# Patient Record
Sex: Female | Born: 1960
Health system: Southern US, Community
[De-identification: ages and names within clinical notes are randomized; demographics above are authoritative.]

## PROBLEM LIST (undated history)

## (undated) DIAGNOSIS — I10 Essential (primary) hypertension: Secondary | ICD-10-CM

## (undated) HISTORY — PX: ANTERIOR CRUCIATE LIGAMENT REPAIR: SHX115

## (undated) HISTORY — DX: Essential (primary) hypertension: I10

---

## 1998-06-04 ENCOUNTER — Other Ambulatory Visit: Admission: RE | Admit: 1998-06-04 | Discharge: 1998-06-04 | Payer: Self-pay | Admitting: Obstetrics & Gynecology

## 1999-07-28 ENCOUNTER — Other Ambulatory Visit: Admission: RE | Admit: 1999-07-28 | Discharge: 1999-07-28 | Payer: Self-pay | Admitting: Obstetrics & Gynecology

## 2000-11-03 ENCOUNTER — Other Ambulatory Visit: Admission: RE | Admit: 2000-11-03 | Discharge: 2000-11-03 | Payer: Self-pay | Admitting: Obstetrics & Gynecology

## 2001-11-12 ENCOUNTER — Other Ambulatory Visit: Admission: RE | Admit: 2001-11-12 | Discharge: 2001-11-12 | Payer: Self-pay | Admitting: Obstetrics and Gynecology

## 2002-11-19 ENCOUNTER — Other Ambulatory Visit: Admission: RE | Admit: 2002-11-19 | Discharge: 2002-11-19 | Payer: Self-pay | Admitting: Obstetrics & Gynecology

## 2003-11-24 ENCOUNTER — Other Ambulatory Visit: Admission: RE | Admit: 2003-11-24 | Discharge: 2003-11-24 | Payer: Self-pay | Admitting: Obstetrics and Gynecology

## 2005-06-20 ENCOUNTER — Other Ambulatory Visit: Admission: RE | Admit: 2005-06-20 | Discharge: 2005-06-20 | Payer: Self-pay | Admitting: Obstetrics & Gynecology

## 2006-12-27 ENCOUNTER — Encounter: Admission: RE | Admit: 2006-12-27 | Discharge: 2006-12-27 | Payer: Self-pay | Admitting: Internal Medicine

## 2008-01-13 IMAGING — CR DG CHEST 2V
2 series · 2 of 2 positions shown · non-contrast
Comparison: none

CLINICAL DATA: Cough

Chest 2 view:
No previous for comparison. Mild thoracic   dextroscoliosis. Lungs clear, mildly
hyperinflated. Heart size and pulmonary vascularity within normal limits. No
effusion.

[view not recorded (1 of 2)]
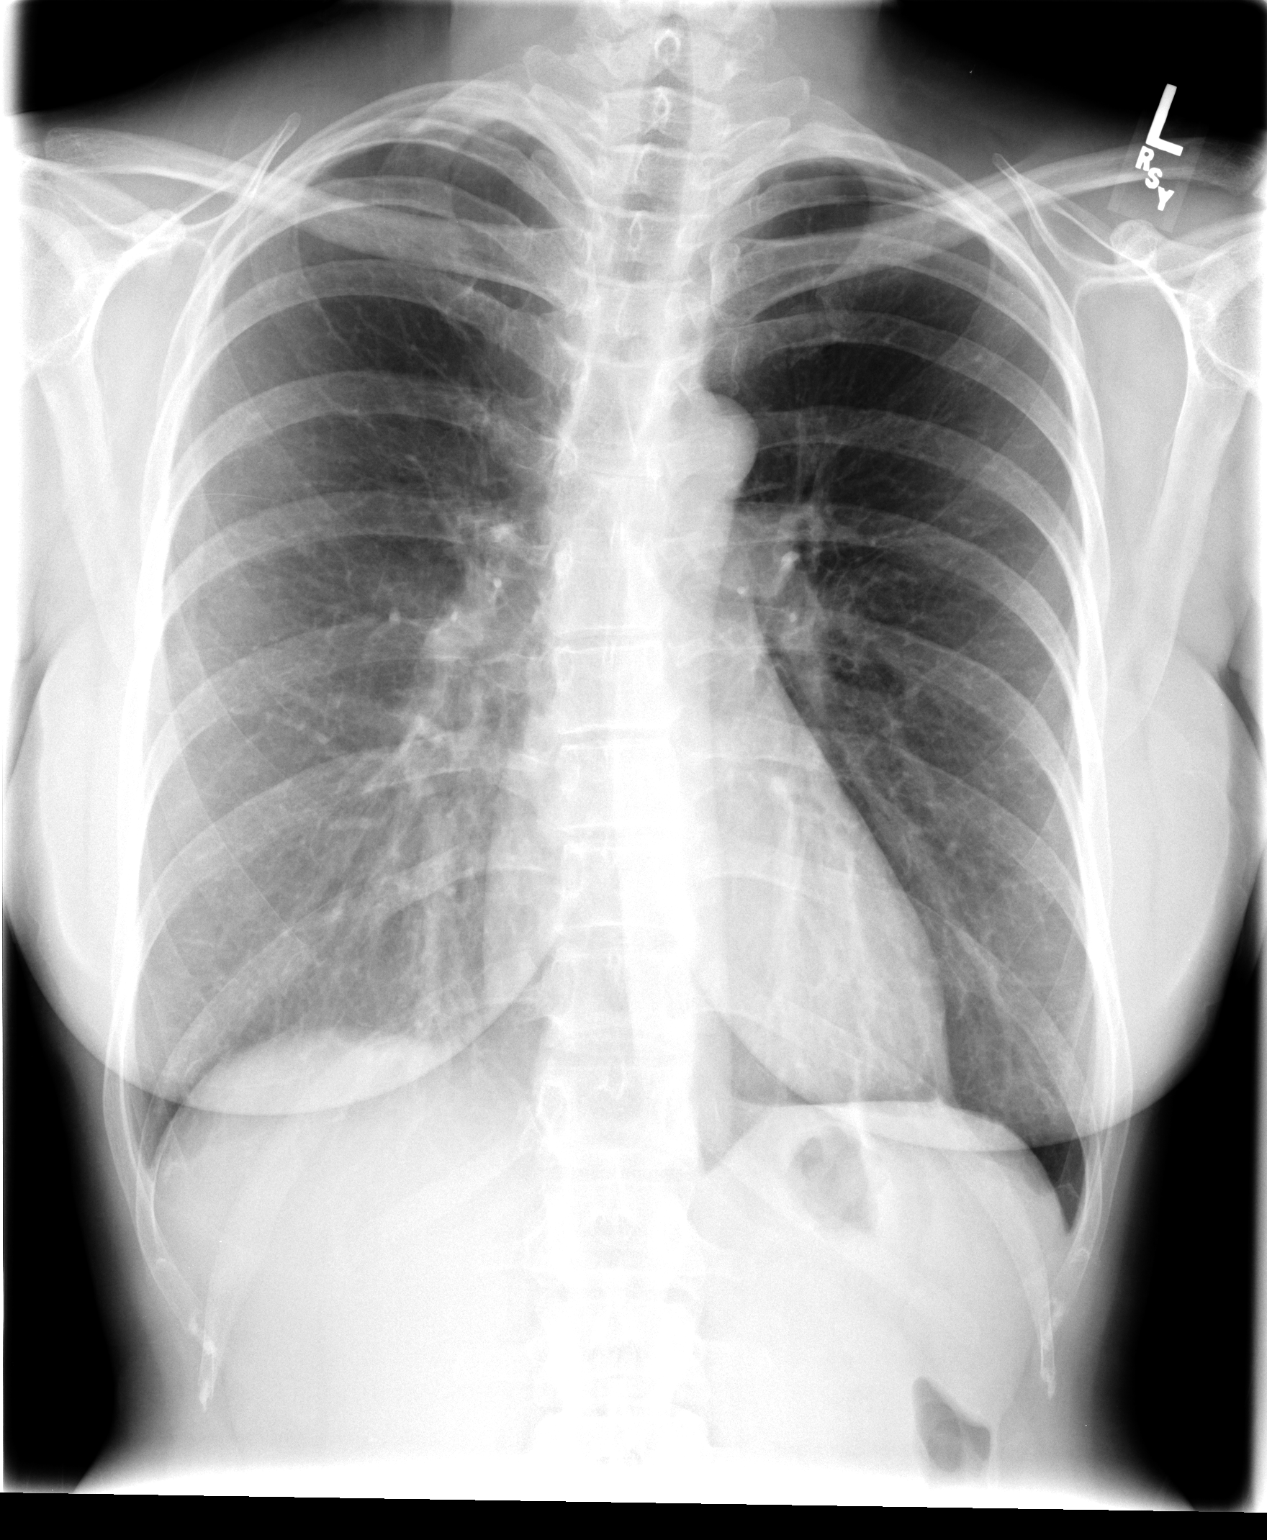

[view not recorded (2 of 2)]
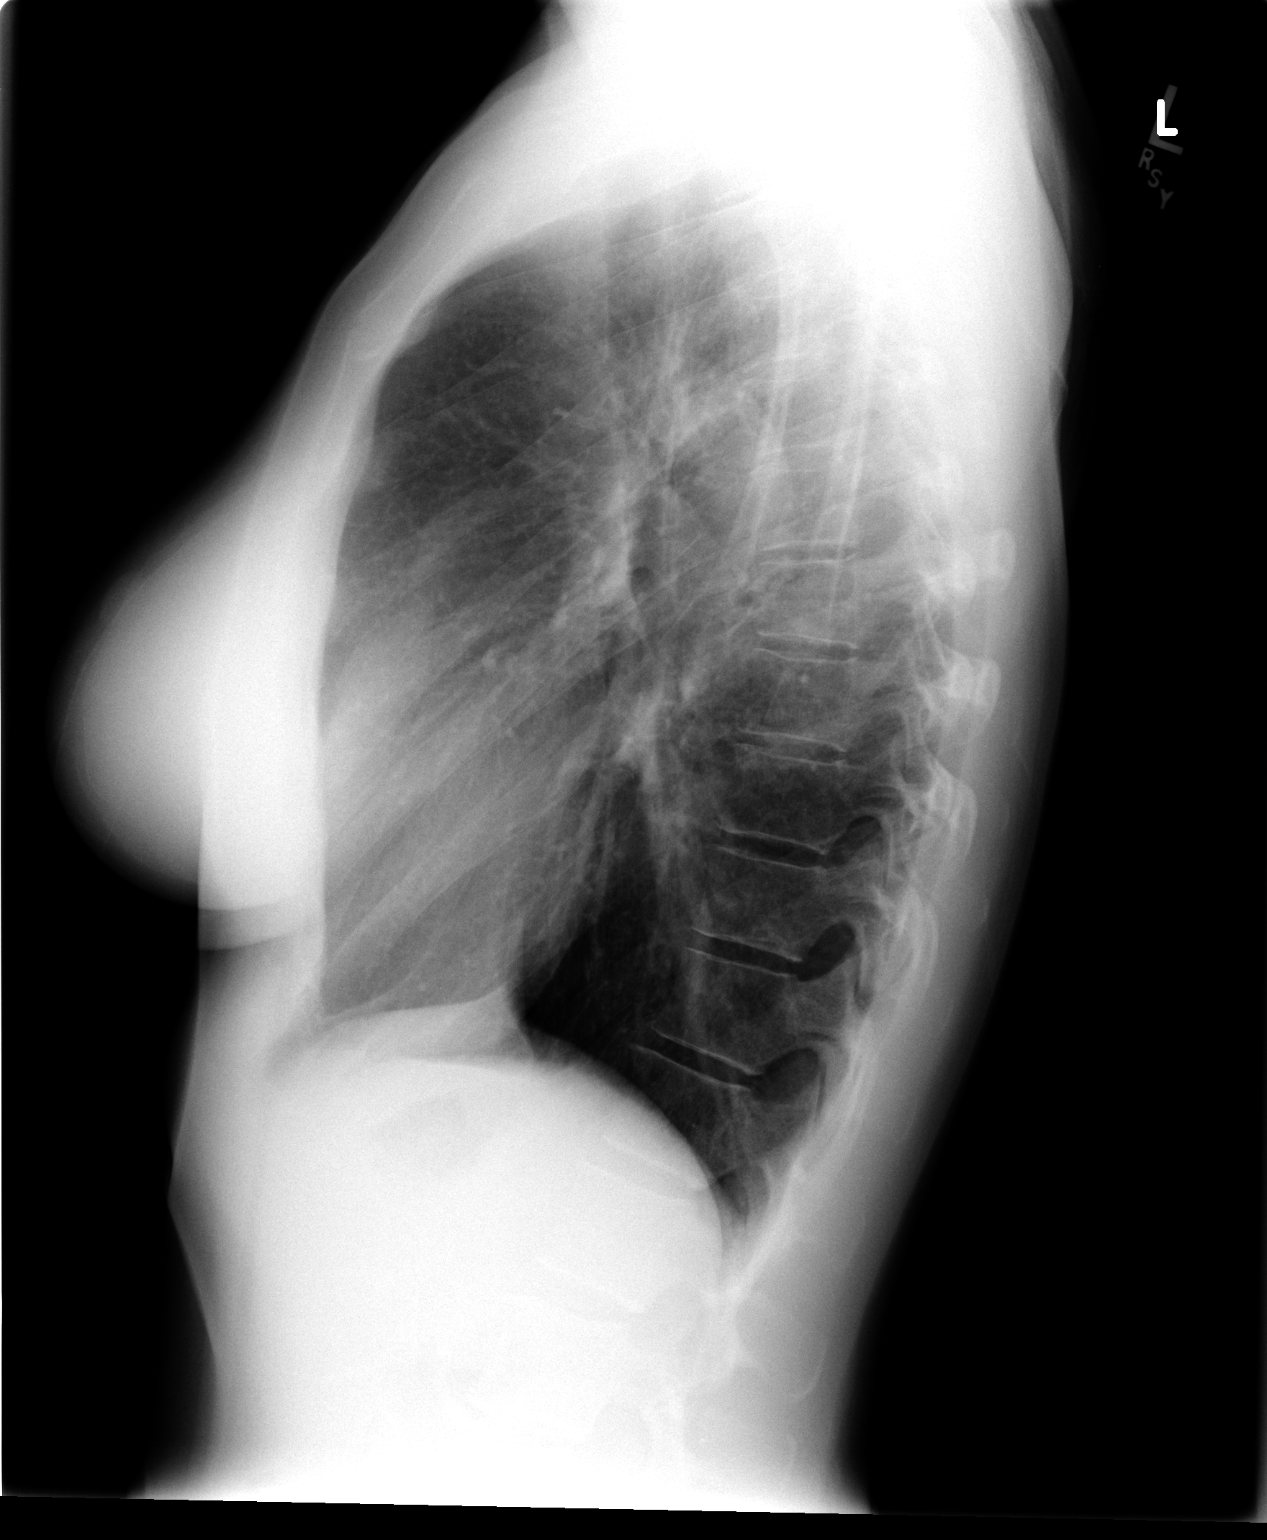

[2 of 2 positions shown; findings below may reference images not displayed]

IMPRESSION: 1. Hyperinflation without acute or superimposed abnormality

## 2008-05-12 ENCOUNTER — Ambulatory Visit: Payer: Self-pay | Admitting: Internal Medicine

## 2008-12-19 ENCOUNTER — Ambulatory Visit: Payer: Self-pay | Admitting: Internal Medicine

## 2008-12-25 ENCOUNTER — Ambulatory Visit: Payer: Self-pay | Admitting: Internal Medicine

## 2009-09-28 ENCOUNTER — Ambulatory Visit: Payer: Self-pay | Admitting: Internal Medicine

## 2010-08-10 ENCOUNTER — Ambulatory Visit: Admit: 2010-08-10 | Payer: Self-pay | Admitting: Internal Medicine

## 2010-08-10 ENCOUNTER — Ambulatory Visit
Admission: RE | Admit: 2010-08-10 | Discharge: 2010-08-10 | Payer: Self-pay | Source: Home / Self Care | Attending: Internal Medicine | Admitting: Internal Medicine

## 2010-12-24 ENCOUNTER — Other Ambulatory Visit: Payer: Self-pay | Admitting: Internal Medicine

## 2010-12-24 MED ORDER — VALACYCLOVIR HCL 500 MG PO TABS: 500.0000 mg | ORAL_TABLET | Freq: Every day | ORAL | Status: AC | PRN

## 2010-12-24 NOTE — Telephone Encounter (Signed)
VALTREX 500 mg 1 po bid x 5 days  Prn #25/ 1 refill sent to [facility]. Advised her that request for xanax would need to wait till Monday when Dr. Lenord Fellers is in the office. She is fine with waiting.

## 2010-12-27 NOTE — Telephone Encounter (Signed)
Rx written per Dr. Lenord Fellers for Xanax 0.5 mg 1/2- 1 po bid prn #40/ 0 refills faxed to [facility]/ L Braidon Chermak LPN

## 2010-12-28 ENCOUNTER — Other Ambulatory Visit: Payer: Self-pay

## 2010-12-28 MED ORDER — ALPRAZOLAM 0.5 MG PO TABS: 0.5000 mg | ORAL_TABLET | Freq: Two times a day (BID) | ORAL | Status: AC

## 2011-07-26 DIAGNOSIS — D229 Melanocytic nevi, unspecified: Secondary | ICD-10-CM

## 2011-07-26 HISTORY — DX: Melanocytic nevi, unspecified: D22.9

## 2011-08-22 ENCOUNTER — Other Ambulatory Visit: Payer: BC Managed Care – PPO | Admitting: Internal Medicine

## 2011-08-22 DIAGNOSIS — Z0000 Encounter for general adult medical examination without abnormal findings: Secondary | ICD-10-CM

## 2011-08-22 LAB — LIPID PANEL
Cholesterol: 225 mg/dL — ABNORMAL HIGH (ref 0–200)
LDL Cholesterol: 133 mg/dL — ABNORMAL HIGH (ref 0–99)
Total CHOL/HDL Ratio: 3.3 Ratio
VLDL: 24 mg/dL (ref 0–40)

## 2011-08-22 LAB — COMPREHENSIVE METABOLIC PANEL
ALT: 18 U/L (ref 0–35)
AST: 24 U/L (ref 0–37)
Albumin: 4.3 g/dL (ref 3.5–5.2)
Alkaline Phosphatase: 125 U/L — ABNORMAL HIGH (ref 39–117)
Calcium: 9.5 mg/dL (ref 8.4–10.5)
Chloride: 102 mEq/L (ref 96–112)
Potassium: 3.9 mEq/L (ref 3.5–5.3)
Sodium: 140 mEq/L (ref 135–145)
Total Protein: 6.5 g/dL (ref 6.0–8.3)

## 2011-08-22 LAB — CBC WITH DIFFERENTIAL/PLATELET
Basophils Absolute: 0 10*3/uL (ref 0.0–0.1)
Basophils Relative: 0 % (ref 0–1)
HCT: 43.1 % (ref 36.0–46.0)
Lymphocytes Relative: 34 % (ref 12–46)
MCHC: 35 g/dL (ref 30.0–36.0)
Monocytes Absolute: 0.4 10*3/uL (ref 0.1–1.0)
Neutro Abs: 3.2 10*3/uL (ref 1.7–7.7)
Neutrophils Relative %: 58 % (ref 43–77)
Platelets: 275 10*3/uL (ref 150–400)
RDW: 12.4 % (ref 11.5–15.5)
WBC: 5.5 10*3/uL (ref 4.0–10.5)

## 2011-08-23 ENCOUNTER — Encounter: Payer: Self-pay | Admitting: Internal Medicine

## 2011-08-23 ENCOUNTER — Ambulatory Visit (INDEPENDENT_AMBULATORY_CARE_PROVIDER_SITE_OTHER): Payer: BC Managed Care – PPO | Admitting: Internal Medicine

## 2011-08-23 VITALS — BP 144/106 | HR 76 | Temp 98.0°F | Ht 64.75 in | Wt 136.0 lb

## 2011-08-23 DIAGNOSIS — Z0000 Encounter for general adult medical examination without abnormal findings: Secondary | ICD-10-CM

## 2011-08-23 DIAGNOSIS — B009 Herpesviral infection, unspecified: Secondary | ICD-10-CM

## 2011-08-23 DIAGNOSIS — I10 Essential (primary) hypertension: Secondary | ICD-10-CM

## 2011-08-23 DIAGNOSIS — Z87891 Personal history of nicotine dependence: Secondary | ICD-10-CM

## 2011-08-23 LAB — POCT URINALYSIS DIPSTICK
Glucose, UA: NEGATIVE
Nitrite, UA: NEGATIVE
Protein, UA: NEGATIVE
Spec Grav, UA: 1.01
Urobilinogen, UA: NEGATIVE

## 2011-09-17 ENCOUNTER — Encounter: Payer: Self-pay | Admitting: Internal Medicine

## 2011-09-17 DIAGNOSIS — B009 Herpesviral infection, unspecified: Secondary | ICD-10-CM | POA: Insufficient documentation

## 2011-09-17 NOTE — Patient Instructions (Signed)
Start antihypertensive medication. Return in 3 weeks. Watch blood pressure at home with home blood pressure monitor. Consider cardiology consultation 2-D echocardiogram to evaluate LVH

## 2011-09-17 NOTE — Progress Notes (Signed)
  Subjective:    Patient ID: Connie Monroe, female    DOB: 12-Oct-1960, 51 y.o.   MRN: [id number]  HPI 51 year old white female with history of herpes simplex type I infection from time to time treated with Valtrex when necessary. In today for health maintenance exam. Blood pressure is markedly elevated. Patient is married. Does not work outside the home. Had TD at vaccine June 2010. She is a former Environmental consultant. Social alcohol consumption. She is smoked for some 25 years and has requested assistance with quitting in the past. Was given Chantix prescription June 2010. She's been here infrequently since 2008 blood pressure has always been under good control in the 120s systolically and 80 -86 diastolically. Patient has been on estrogen replacement. Has been smoking 1/4-1/2 half pack per day.  Has 3 children- a daughter and 2 sons.  Family history: father with history of diabetes. One sister with epilepsy. Maternal grandmother with history of coronary artery disease and hypertension.  During this blood pressure markedly elevated at 106 diastolically. Systolic blood pressure was 148. We performed an EKG and found that she had LVH on EKG.  Continues to take estrogen replacement via gel preparation. Takes multivitamin. Is on Effexor 75 mg daily and when necessary Klonopin for anxiety.  Patient had uterine ablation July 2007. Dr. Konrad Dolores is GYN physician.  History of headaches.    Review of Systems  Constitutional: Negative.   HENT: Negative.   Eyes: Negative.   Respiratory: Negative.   Cardiovascular: Negative for chest pain, palpitations and leg swelling.  Genitourinary: Negative.   Musculoskeletal: Negative.   Neurological:       History of headaches  Hematological: Negative.   Psychiatric/Behavioral:       History of anxiety       Objective:   Physical Exam  Nursing note and vitals reviewed. Constitutional: She is oriented to person, place, and time. She  appears well-developed and well-nourished. No distress.  HENT:  Head: Normocephalic and atraumatic.  Right Ear: External ear normal.  Left Ear: External ear normal.  Mouth/Throat: Oropharynx is clear and moist.  Eyes: Right eye exhibits no discharge. Left eye exhibits no discharge.  Neck: Neck supple. No JVD present. No thyromegaly present.       No bruits  Cardiovascular: Normal rate, regular rhythm and normal heart sounds.   No murmur heard. Pulmonary/Chest: Effort normal and breath sounds normal. She has no wheezes. She has no rales.  Abdominal: Soft. Bowel sounds are normal. She exhibits no distension and no mass. There is no tenderness. There is no rebound and no guarding.       No bruits  Genitourinary:       Deferred to GYN  Musculoskeletal: She exhibits no edema.  Lymphadenopathy:    She has no cervical adenopathy.  Neurological: She is alert and oriented to person, place, and time. She has normal reflexes. No cranial nerve deficit. Coordination normal.  Skin: Skin is warm and dry. She is not diaphoretic.  Psychiatric: She has a normal mood and affect. Her behavior is normal.          Assessment & Plan:  Hypertension-new onset  History of smoking  History of anxiety  History of headaches  History of estrogen replacement per GYN  Plan: Patient will be started on antihypertensive medication. Patient may need 2-D echocardiogram because of finding of LVH. Previous EKG done in 2008 had no LVH. Fasting labs drawn. Patient needs to quit smoking.

## 2011-10-11 ENCOUNTER — Encounter: Payer: Self-pay | Admitting: Internal Medicine

## 2011-11-16 ENCOUNTER — Telehealth: Payer: Self-pay

## 2011-11-16 NOTE — Telephone Encounter (Signed)
Spoke with patient today re: BP. She was to call us with the name of a cardiologist she had seen once. States she just had her Gyn checkup with Dr. Konrad Dolores and BP there was ok/ 128/80 something. Is going back there to see him in a week and I have asked her to have his note faxed here. She has scheduled a CPE with Dr. Lenord Fellers in August. Currently not taking any BP meds.

## 2012-03-08 ENCOUNTER — Other Ambulatory Visit: Payer: BC Managed Care – PPO | Admitting: Internal Medicine

## 2012-03-08 DIAGNOSIS — Z0000 Encounter for general adult medical examination without abnormal findings: Secondary | ICD-10-CM

## 2012-03-08 LAB — CBC WITH DIFFERENTIAL/PLATELET
Basophils Absolute: 0 10*3/uL (ref 0.0–0.1)
Eosinophils Absolute: 0.1 10*3/uL (ref 0.0–0.7)
Eosinophils Relative: 2 % (ref 0–5)
Lymphocytes Relative: 33 % (ref 12–46)
MCH: 32.8 pg (ref 26.0–34.0)
MCV: 89.1 fL (ref 78.0–100.0)
Neutrophils Relative %: 56 % (ref 43–77)
Platelets: 243 10*3/uL (ref 150–400)
RBC: 4.48 MIL/uL (ref 3.87–5.11)
RDW: 12.9 % (ref 11.5–15.5)
WBC: 4.5 10*3/uL (ref 4.0–10.5)

## 2012-03-08 LAB — TSH: TSH: 0.73 u[IU]/mL (ref 0.350–4.500)

## 2012-03-08 LAB — LIPID PANEL
LDL Cholesterol: 99 mg/dL (ref 0–99)
Total CHOL/HDL Ratio: 3.1 Ratio
VLDL: 20 mg/dL (ref 0–40)

## 2012-03-08 LAB — COMPREHENSIVE METABOLIC PANEL
ALT: 12 U/L (ref 0–35)
AST: 16 U/L (ref 0–37)
Alkaline Phosphatase: 113 U/L (ref 39–117)
Creat: 0.92 mg/dL (ref 0.50–1.10)
Sodium: 141 mEq/L (ref 135–145)
Total Bilirubin: 0.7 mg/dL (ref 0.3–1.2)
Total Protein: 6.5 g/dL (ref 6.0–8.3)

## 2012-03-09 ENCOUNTER — Encounter (INDEPENDENT_AMBULATORY_CARE_PROVIDER_SITE_OTHER): Payer: BC Managed Care – PPO | Admitting: Internal Medicine

## 2012-03-09 ENCOUNTER — Ambulatory Visit (INDEPENDENT_AMBULATORY_CARE_PROVIDER_SITE_OTHER): Payer: BC Managed Care – PPO | Admitting: Internal Medicine

## 2012-03-09 ENCOUNTER — Encounter: Payer: Self-pay | Admitting: Internal Medicine

## 2012-03-09 VITALS — BP 130/84

## 2012-03-09 DIAGNOSIS — F430 Acute stress reaction: Secondary | ICD-10-CM

## 2012-03-09 DIAGNOSIS — F439 Reaction to severe stress, unspecified: Secondary | ICD-10-CM

## 2012-03-09 DIAGNOSIS — I10 Essential (primary) hypertension: Secondary | ICD-10-CM

## 2012-03-09 LAB — VITAMIN D 25 HYDROXY (VIT D DEFICIENCY, FRACTURES): Vit D, 25-Hydroxy: 54 ng/mL (ref 30–89)

## 2012-03-15 ENCOUNTER — Encounter: Payer: Self-pay | Admitting: Internal Medicine

## 2012-03-15 DIAGNOSIS — I10 Essential (primary) hypertension: Secondary | ICD-10-CM | POA: Insufficient documentation

## 2012-03-15 NOTE — Progress Notes (Signed)
  Subjective:    Patient ID: Connie Monroe, female    DOB: [birth year], 51 y.o.   MRN: [id number]  HPI 51 year old white female with essential hypertension in today for recheck. She was here in February for physical examination. Antihypertensive medication was prescribed at that time but she did not get it filled. She said her blood pressure remained fairly normal but recently has been elevated. She been under a fair amount of stress with some family issues. We initially checked it today it was 134/92. We have had a long discussion about this. I do think she has labile hypertension. It might be best if she took low-dose antihypertensive medication to keep it more controlled rather than have labile swings. I spent about 20 minutes talking with patient about these issues today. She does exercise and take care of herself. She doesn't eat much salt.    Review of Systems     Objective:   Physical Exam neck is supple without JVD thyromegaly or carotid bruits. Chest clear to auscultation. Cardiac exam regular rate and rhythm normal S1 and S2 extremities without edema. Skin is  warm and dry.        Assessment & Plan:  Labile hypertension  Plan: I suggest patient start medication. She may want to purchase a home blood pressure monitor. I have given her prescription for that and suggest she start monitoring her blood pressures at home. She can let he know how things are going in 3 weeks or so.

## 2012-03-15 NOTE — Patient Instructions (Addendum)
Call in 3 weeks and let he know how blood pressure is doing

## 2012-03-20 NOTE — Progress Notes (Signed)
  Subjective:    Patient ID: Connie Monroe, female    DOB: [birth year], 51 y.o.   MRN: [id number]  HPI Erroneous encounter    Review of Systems     Objective:   Physical Exam        Assessment & Plan:

## 2012-10-19 ENCOUNTER — Ambulatory Visit (INDEPENDENT_AMBULATORY_CARE_PROVIDER_SITE_OTHER): Payer: BC Managed Care – PPO | Admitting: Internal Medicine

## 2012-10-19 ENCOUNTER — Encounter: Payer: Self-pay | Admitting: Internal Medicine

## 2012-10-19 VITALS — BP 140/98 | HR 80 | Temp 98.2°F | Wt 134.0 lb

## 2012-10-19 DIAGNOSIS — J069 Acute upper respiratory infection, unspecified: Secondary | ICD-10-CM

## 2012-10-29 ENCOUNTER — Ambulatory Visit: Payer: Self-pay | Admitting: Internal Medicine

## 2012-11-11 ENCOUNTER — Encounter: Payer: Self-pay | Admitting: Internal Medicine

## 2012-11-11 NOTE — Progress Notes (Signed)
  Subjective:    Patient ID: Connie Monroe, female    DOB: 24-Aug-1960, 52 y.o.   MRN: [id number]  HPI 52 year old female with history of essential hypertension in today with URI symptoms, malaise, and fatigue. No shaking chills or significant cough. Nasal congestion and stuffiness. Leaving soon to go on trip.    Review of Systems     Objective:   Physical Exam HEENT exam: TMs are clear, pharynx slightly injected without exudate. Sounds nasally congested. Neck is supple without significant adenopathy. Chest clear.        Assessment & Plan:  Essential hypertension-blood pressure elevated today I think because patient not feeling well. Says blood pressure is fine at home.  Acute URI  Plan: Levaquin 500 milligrams daily for 7 days.

## 2012-11-11 NOTE — Patient Instructions (Addendum)
Take antibiotic as prescribed. Call if not better in 7-10 days

## 2012-12-18 ENCOUNTER — Other Ambulatory Visit: Payer: BC Managed Care – PPO | Admitting: Internal Medicine

## 2012-12-18 DIAGNOSIS — Z1329 Encounter for screening for other suspected endocrine disorder: Secondary | ICD-10-CM

## 2012-12-18 DIAGNOSIS — Z0000 Encounter for general adult medical examination without abnormal findings: Secondary | ICD-10-CM

## 2012-12-18 DIAGNOSIS — I10 Essential (primary) hypertension: Secondary | ICD-10-CM

## 2012-12-18 DIAGNOSIS — Z13220 Encounter for screening for lipoid disorders: Secondary | ICD-10-CM

## 2012-12-18 DIAGNOSIS — Z130 Encounter for screening for diseases of the blood and blood-forming organs and certain disorders involving the immune mechanism: Secondary | ICD-10-CM

## 2012-12-18 LAB — COMPREHENSIVE METABOLIC PANEL
ALT: 14 U/L (ref 0–35)
AST: 21 U/L (ref 0–37)
Albumin: 4.2 g/dL (ref 3.5–5.2)
Alkaline Phosphatase: 118 U/L — ABNORMAL HIGH (ref 39–117)
BUN: 13 mg/dL (ref 6–23)
Potassium: 4.2 mEq/L (ref 3.5–5.3)

## 2012-12-18 LAB — CBC WITH DIFFERENTIAL/PLATELET
Basophils Absolute: 0 10*3/uL (ref 0.0–0.1)
Basophils Relative: 1 % (ref 0–1)
HCT: 40.8 % (ref 36.0–46.0)
MCHC: 35.3 g/dL (ref 30.0–36.0)
Monocytes Absolute: 0.4 10*3/uL (ref 0.1–1.0)
Neutro Abs: 2.4 10*3/uL (ref 1.7–7.7)
Neutrophils Relative %: 53 % (ref 43–77)
Platelets: 247 10*3/uL (ref 150–400)
RDW: 13 % (ref 11.5–15.5)

## 2012-12-18 LAB — TSH: TSH: 0.957 u[IU]/mL (ref 0.350–4.500)

## 2012-12-18 LAB — LIPID PANEL
HDL: 67 mg/dL (ref >39)
LDL Cholesterol: 106 mg/dL — ABNORMAL HIGH (ref 0–99)
Total CHOL/HDL Ratio: 2.8 Ratio

## 2012-12-20 ENCOUNTER — Ambulatory Visit (INDEPENDENT_AMBULATORY_CARE_PROVIDER_SITE_OTHER): Payer: BC Managed Care – PPO | Admitting: Internal Medicine

## 2012-12-20 ENCOUNTER — Encounter: Payer: Self-pay | Admitting: Internal Medicine

## 2012-12-20 VITALS — BP 126/94 | HR 80 | Temp 98.6°F | Ht 65.0 in | Wt 135.0 lb

## 2012-12-20 DIAGNOSIS — F411 Generalized anxiety disorder: Secondary | ICD-10-CM

## 2012-12-20 DIAGNOSIS — B009 Herpesviral infection, unspecified: Secondary | ICD-10-CM

## 2012-12-20 DIAGNOSIS — I10 Essential (primary) hypertension: Secondary | ICD-10-CM

## 2012-12-20 DIAGNOSIS — Z5181 Encounter for therapeutic drug level monitoring: Secondary | ICD-10-CM

## 2012-12-20 DIAGNOSIS — Z0000 Encounter for general adult medical examination without abnormal findings: Secondary | ICD-10-CM

## 2012-12-20 DIAGNOSIS — Z87891 Personal history of nicotine dependence: Secondary | ICD-10-CM

## 2012-12-20 LAB — POCT URINALYSIS DIPSTICK
Blood, UA: NEGATIVE
Protein, UA: NEGATIVE
Spec Grav, UA: 1.01
Urobilinogen, UA: NEGATIVE

## 2012-12-20 MED ORDER — LOSARTAN POTASSIUM 25 MG PO TABS: 25.0000 mg | ORAL_TABLET | Freq: Every day | ORAL | Status: DC

## 2013-01-14 DIAGNOSIS — Z79890 Hormone replacement therapy (postmenopausal): Secondary | ICD-10-CM | POA: Insufficient documentation

## 2013-01-14 DIAGNOSIS — F411 Generalized anxiety disorder: Secondary | ICD-10-CM | POA: Insufficient documentation

## 2013-01-14 DIAGNOSIS — Z5181 Encounter for therapeutic drug level monitoring: Secondary | ICD-10-CM | POA: Insufficient documentation

## 2013-01-14 DIAGNOSIS — Z87891 Personal history of nicotine dependence: Secondary | ICD-10-CM | POA: Insufficient documentation

## 2013-01-14 NOTE — Progress Notes (Signed)
  Subjective:    Patient ID: Connie Monroe, female    DOB: 10/24/1960, 52 y.o.   MRN: [id number]  HPI 52 year old white female for health maintenance and evaluation of medical problems. Both of her sons have been in in boarding school. She's been busy helping oldest son get ready for [employer] He will be a freshman there. Youngest son has had some issues at boarding school living in dorm where roommates were bad influences on him. He will be attending another boarding school in [place] this fall.  She has a history of herpes simplex type I infection from time to time treated with when necessary Valtrex. History of hypertension.  Social history: She is married. Does not work outside the home. She is a former Environmental consultant. Social alcohol consumption. She has smoked for some 25 years a few cigarettes daily. Was given Chantix prescription June 2010. Smokes about a quarter pack per day. Has 3 children a daughter and 2 sons.  Dr. Konrad Dolores is GYN physician. She had uterine ablation July 2007. She is on estrogen replacement.  History of LVH on EKG.  History of anxiety depression treated with Effexor and Klonopin.  Family history: Father with history of diabetes. One sister with epilepsy. Maternal grandmother with history of coronary artery disease and hypertension. This     Review of Systems  Constitutional: Negative.   All other systems reviewed and are negative.       Objective:   Physical Exam  Vitals reviewed. Constitutional: She is oriented to person, place, and time. She appears well-developed and well-nourished. No distress.  HENT:  Head: Normocephalic and atraumatic.  Right Ear: External ear normal.  Left Ear: External ear normal.  Mouth/Throat: Oropharynx is clear and moist. No oropharyngeal exudate.  Eyes: Conjunctivae and EOM are normal. Pupils are equal, round, and reactive to light. Right eye exhibits no discharge. Left eye exhibits no discharge.  No scleral icterus.  Neck: Neck supple. No JVD present. No thyromegaly present.  Cardiovascular: Normal rate, regular rhythm, normal heart sounds and intact distal pulses.   No murmur heard. Pulmonary/Chest: Effort normal and breath sounds normal. No respiratory distress. She has no wheezes. She has no rales.  Breasts normal female  Abdominal: Soft. Bowel sounds are normal. She exhibits no distension and no mass. There is no tenderness. There is no rebound and no guarding.  Genitourinary:  Deferred to GYN  Neurological: She is oriented to person, place, and time. She has normal reflexes. No cranial nerve deficit.  Skin: Skin is warm and dry. No rash noted. She is not diaphoretic.  Psychiatric: She has a normal mood and affect. Her behavior is normal. Judgment and thought content normal.          Assessment & Plan:  Hypertension-stable on medication  History of smoking-patient needs to quit. Doesn't seem ready to quit.  History of anxiety depression which may be why she smokes  History of herpes simplex type I  Plan: Return in 6 months and continue same medications.

## 2013-01-14 NOTE — Patient Instructions (Addendum)
Continue same medications and return in 6 months. Please stop smoking. Consider colonoscopy

## 2013-03-05 ENCOUNTER — Telehealth: Payer: Self-pay

## 2013-03-05 NOTE — Telephone Encounter (Signed)
Patient scheduled for consultation with Dr. Loreta Ave on 03/14/2013 at 2:15 pm, for a screening colonoscopy.

## 2013-03-26 ENCOUNTER — Ambulatory Visit (INDEPENDENT_AMBULATORY_CARE_PROVIDER_SITE_OTHER): Payer: BC Managed Care – PPO | Admitting: Internal Medicine

## 2013-03-26 ENCOUNTER — Encounter: Payer: Self-pay | Admitting: Internal Medicine

## 2013-03-26 VITALS — BP 118/96 | HR 68 | Wt 136.0 lb

## 2013-03-26 DIAGNOSIS — I10 Essential (primary) hypertension: Secondary | ICD-10-CM

## 2013-03-26 NOTE — Patient Instructions (Addendum)
Take losartan every morning on a regular basis. Keep an eye  on your blood pressure and call if diastolic blood pressure remains elevated. Return in 6 months

## 2013-03-26 NOTE — Progress Notes (Signed)
  Subjective:    Patient ID: Connie Monroe, female    DOB: 1960-08-15, 52 y.o.   MRN: [id number]  HPI Six-month followup on hypertension. She is on losartan 25 mg daily. Admits that she's not been taking it regularly. She's been taking it at night. Today, she just took it for short time before coming to the office. Her diastolic blood pressure is elevated.    Review of Systems     Objective:   Physical Exam Rechecked  blood pressure and got a similar reading at 90 diastolically. Chest clear to auscultation. Cardiac exam regular rate and rhythm. Extremities without edema.       Assessment & Plan:  Hypertension  Plan: Patient is to take losartan every morning on a regular basis. She'll keep in on her blood pressure and call me if it remains elevated diastolically. Otherwise return in 6 months for physical exam.

## 2013-12-24 ENCOUNTER — Other Ambulatory Visit: Payer: Self-pay | Admitting: Internal Medicine

## 2013-12-24 NOTE — Telephone Encounter (Signed)
Due for CPE after Sept 9th. Please contact before refilling

## 2014-01-27 ENCOUNTER — Other Ambulatory Visit: Payer: Self-pay | Admitting: Internal Medicine

## 2014-01-27 ENCOUNTER — Other Ambulatory Visit: Payer: BC Managed Care – PPO | Admitting: Internal Medicine

## 2014-01-27 DIAGNOSIS — I10 Essential (primary) hypertension: Secondary | ICD-10-CM

## 2014-01-27 DIAGNOSIS — Z0000 Encounter for general adult medical examination without abnormal findings: Secondary | ICD-10-CM

## 2014-01-27 DIAGNOSIS — Z1329 Encounter for screening for other suspected endocrine disorder: Secondary | ICD-10-CM

## 2014-01-27 DIAGNOSIS — Z130 Encounter for screening for diseases of the blood and blood-forming organs and certain disorders involving the immune mechanism: Secondary | ICD-10-CM

## 2014-01-27 DIAGNOSIS — Z13220 Encounter for screening for lipoid disorders: Secondary | ICD-10-CM

## 2014-01-27 LAB — LIPID PANEL
Cholesterol: 168 mg/dL (ref 0–200)
HDL: 58 mg/dL (ref >39)
LDL CALC: 91 mg/dL (ref 0–99)
TRIGLYCERIDES: 97 mg/dL (ref <150)
Total CHOL/HDL Ratio: 2.9 Ratio
VLDL: 19 mg/dL (ref 0–40)

## 2014-01-27 LAB — CBC WITH DIFFERENTIAL/PLATELET
BASOS PCT: 1 % (ref 0–1)
Basophils Absolute: 0 10*3/uL (ref 0.0–0.1)
Eosinophils Absolute: 0.1 10*3/uL (ref 0.0–0.7)
Eosinophils Relative: 2 % (ref 0–5)
HEMATOCRIT: 40.2 % (ref 36.0–46.0)
HEMOGLOBIN: 14.1 g/dL (ref 12.0–15.0)
LYMPHS ABS: 1.7 10*3/uL (ref 0.7–4.0)
LYMPHS PCT: 42 % (ref 12–46)
MCH: 32.3 pg (ref 26.0–34.0)
MCHC: 35.1 g/dL (ref 30.0–36.0)
MCV: 92 fL (ref 78.0–100.0)
MONO ABS: 0.3 10*3/uL (ref 0.1–1.0)
MONOS PCT: 7 % (ref 3–12)
NEUTROS ABS: 2 10*3/uL (ref 1.7–7.7)
NEUTROS PCT: 48 % (ref 43–77)
Platelets: 229 10*3/uL (ref 150–400)
RBC: 4.37 MIL/uL (ref 3.87–5.11)
RDW: 13.4 % (ref 11.5–15.5)
WBC: 4.1 10*3/uL (ref 4.0–10.5)

## 2014-01-27 LAB — COMPREHENSIVE METABOLIC PANEL
ALBUMIN: 4.4 g/dL (ref 3.5–5.2)
ALK PHOS: 125 U/L — AB (ref 39–117)
ALT: 17 U/L (ref 0–35)
AST: 21 U/L (ref 0–37)
BUN: 15 mg/dL (ref 6–23)
CALCIUM: 8.9 mg/dL (ref 8.4–10.5)
CHLORIDE: 104 meq/L (ref 96–112)
CO2: 29 mEq/L (ref 19–32)
Creat: 0.92 mg/dL (ref 0.50–1.10)
GLUCOSE: 81 mg/dL (ref 70–99)
POTASSIUM: 4 meq/L (ref 3.5–5.3)
SODIUM: 140 meq/L (ref 135–145)
TOTAL PROTEIN: 6.3 g/dL (ref 6.0–8.3)
Total Bilirubin: 0.5 mg/dL (ref 0.2–1.2)

## 2014-01-28 ENCOUNTER — Ambulatory Visit (INDEPENDENT_AMBULATORY_CARE_PROVIDER_SITE_OTHER): Payer: BC Managed Care – PPO | Admitting: Internal Medicine

## 2014-01-28 ENCOUNTER — Encounter: Payer: Self-pay | Admitting: Internal Medicine

## 2014-01-28 VITALS — BP 118/84 | HR 80 | Temp 98.0°F | Ht 64.5 in | Wt 138.0 lb

## 2014-01-28 DIAGNOSIS — Z87891 Personal history of nicotine dependence: Secondary | ICD-10-CM

## 2014-01-28 DIAGNOSIS — Z7189 Other specified counseling: Secondary | ICD-10-CM

## 2014-01-28 DIAGNOSIS — B009 Herpesviral infection, unspecified: Secondary | ICD-10-CM

## 2014-01-28 DIAGNOSIS — Z0000 Encounter for general adult medical examination without abnormal findings: Secondary | ICD-10-CM

## 2014-01-28 DIAGNOSIS — Z79890 Hormone replacement therapy (postmenopausal): Secondary | ICD-10-CM

## 2014-01-28 DIAGNOSIS — I10 Essential (primary) hypertension: Secondary | ICD-10-CM

## 2014-01-28 LAB — POCT URINALYSIS DIPSTICK
Bilirubin, UA: NEGATIVE
Blood, UA: NEGATIVE
GLUCOSE UA: NEGATIVE
KETONES UA: NEGATIVE
LEUKOCYTES UA: NEGATIVE
Nitrite, UA: NEGATIVE
PROTEIN UA: NEGATIVE
Spec Grav, UA: 1.01
UROBILINOGEN UA: NEGATIVE
pH, UA: 7

## 2014-01-28 LAB — VITAMIN D 25 HYDROXY (VIT D DEFICIENCY, FRACTURES): Vit D, 25-Hydroxy: 59 ng/mL (ref 30–89)

## 2014-01-28 LAB — TSH: TSH: 1.016 u[IU]/mL (ref 0.350–4.500)

## 2014-01-28 LAB — HEMOGLOBIN A1C
HEMOGLOBIN A1C: 5.3 % (ref <5.7)
MEAN PLASMA GLUCOSE: 105 mg/dL (ref <117)

## 2014-01-28 MED ORDER — LOSARTAN POTASSIUM 25 MG PO TABS: 25.0000 mg | ORAL_TABLET | Freq: Every day | ORAL | Status: DC

## 2014-01-28 MED ORDER — VARENICLINE TARTRATE 0.5 MG X 11 & 1 MG X 42 PO MISC: ORAL | Status: DC

## 2014-04-05 NOTE — Progress Notes (Signed)
   Subjective:    Patient ID: Connie Monroe, female    DOB: Jul 04, 1961, 53 y.o.   MRN: [id number]  HPI  53 year old White Female in today for health maintenance exam and evaluation of medical issues. History of hypertension and will Zora. History of herpes simplex type I. History of smoking and anxiety.  History of LVH on EKG.  Dr. Dory Horn is GYN physician. She had uterine ablation July 2007.  Social history: She is married. Does not work outside the home. She is a former Teaching laboratory technician. Social alcohol consumption. Has smoked for over 25 years just a few cigarettes a day. Has 3 children, a daughter and 2 sons.  Family history: Father with history of diabetes. One sister with epilepsy. Maternal grandmother with history of coronary artery disease and hypertension.     Review of Systems  Constitutional: Negative.   All other systems reviewed and are negative.      Objective:   Physical Exam  Vitals reviewed. Constitutional: She is oriented to person, place, and time. She appears well-developed and well-nourished. No distress.  HENT:  Head: Normocephalic and atraumatic.  Right Ear: External ear normal.  Left Ear: External ear normal.  Mouth/Throat: Oropharynx is clear and moist. No oropharyngeal exudate.  Eyes: Conjunctivae and EOM are normal. Pupils are equal, round, and reactive to light. Right eye exhibits no discharge. Left eye exhibits no discharge. No scleral icterus.  Neck: Neck supple. No JVD present. No thyromegaly present.  Cardiovascular: Normal rate, regular rhythm, normal heart sounds and intact distal pulses.   No murmur heard. Pulmonary/Chest: Effort normal and breath sounds normal. No respiratory distress. She has no wheezes. She has no rales.  Breasts normal female  Abdominal: Soft. Bowel sounds are normal. She exhibits no distension and no mass. There is no rebound and no guarding.  Genitourinary:  Deferred to GYN  Musculoskeletal: Normal range  of motion. She exhibits no edema.  Lymphadenopathy:    She has no cervical adenopathy.  Neurological: She is alert and oriented to person, place, and time. She has normal reflexes. No cranial nerve deficit.  Skin: Skin is warm and dry. No rash noted. She is not diaphoretic.  Psychiatric: She has a normal mood and affect. Her behavior is normal. Judgment and thought content normal.          Assessment:  History of smoking  History of question replacement  History of anxiety  sment & Plan:   Hypertension-stable on low-dose Cozaar  History of anxiety  History of smoking-ask patient to stop smoking  History of estrogen replacement  History of herpes simplex type I  Plan: Continue to monitor her blood pressure at home. Return in one year or as needed.

## 2014-04-06 ENCOUNTER — Encounter: Payer: Self-pay | Admitting: Internal Medicine

## 2014-04-06 NOTE — Patient Instructions (Signed)
It was a pleasure to see you today. Continue low-dose Cozaar. Consider quitting smoking. Return in one year. Continue to monitor blood pressure at home.

## 2014-05-16 ENCOUNTER — Ambulatory Visit (INDEPENDENT_AMBULATORY_CARE_PROVIDER_SITE_OTHER): Payer: BC Managed Care – PPO | Admitting: Internal Medicine

## 2014-05-16 VITALS — BP 112/84 | HR 78 | Temp 96.6°F | Wt 135.0 lb

## 2014-05-16 DIAGNOSIS — J069 Acute upper respiratory infection, unspecified: Secondary | ICD-10-CM

## 2014-05-16 MED ORDER — BENZONATATE 200 MG PO CAPS: 200.0000 mg | ORAL_CAPSULE | Freq: Three times a day (TID) | ORAL | Status: DC | PRN

## 2014-05-16 MED ORDER — AZITHROMYCIN 250 MG PO TABS: ORAL_TABLET | ORAL | Status: DC

## 2014-05-16 NOTE — Progress Notes (Signed)
   Subjective:    Patient ID: Connie Monroe, female    DOB: Jan 12, 1961, 53 y.o.   MRN: [id number]  HPI  Three week history of URI symptoms now with protracted cough. No fever or shaking chills. Cough is slightly productive.    Review of Systems     Objective:   Physical Exam Pharynx slightly injected. TMs slightly full bilaterally. Neck is supple. Chest clear without rales or wheezing       Assessment & Plan:  Protracted upper respiratory infection  Plan: Zithromax Z-Pak take as directed. Tessalon Perles 3 times daily as needed for cough.  15 minutes spent with patient

## 2014-05-19 ENCOUNTER — Ambulatory Visit: Payer: BC Managed Care – PPO | Admitting: Internal Medicine

## 2014-06-16 ENCOUNTER — Encounter: Payer: Self-pay | Admitting: Internal Medicine

## 2014-06-16 NOTE — Patient Instructions (Signed)
Take Zithromax and Tessalon Perles as directed

## 2014-10-07 ENCOUNTER — Ambulatory Visit (INDEPENDENT_AMBULATORY_CARE_PROVIDER_SITE_OTHER): Payer: BLUE CROSS/BLUE SHIELD | Admitting: Internal Medicine

## 2014-10-07 ENCOUNTER — Encounter: Payer: Self-pay | Admitting: Internal Medicine

## 2014-10-07 VITALS — BP 124/78 | HR 88 | Temp 98.0°F | Wt 137.0 lb

## 2014-10-07 DIAGNOSIS — J069 Acute upper respiratory infection, unspecified: Secondary | ICD-10-CM | POA: Diagnosis not present

## 2014-10-07 MED ORDER — BENZONATATE 200 MG PO CAPS: 200.0000 mg | ORAL_CAPSULE | Freq: Three times a day (TID) | ORAL | Status: DC | PRN

## 2014-10-07 MED ORDER — LEVOFLOXACIN 500 MG PO TABS: 500.0000 mg | ORAL_TABLET | Freq: Every day | ORAL | Status: DC

## 2014-10-07 NOTE — Patient Instructions (Signed)
Take Levaquin 500 milligrams daily for 7-10 days. Tessalon Perles 200 mg 3 times daily as needed for cough.

## 2014-10-07 NOTE — Progress Notes (Signed)
   Subjective:    Patient ID: Connie Monroe, female    DOB: March 16, 1961, 54 y.o.   MRN: [id number]  HPI 54 year old White Female leaving to go to [place] with her son for Spring breakin today with acute respiratory infection and sore throat.has cough and congestion. No documented fever or shaking chills.   Review of Systems     Objective:   Physical Exam  Skin warm and dry. Nodes none. Pharynx very slightly injected without exudate. TMs are clear. No fullness of TMs. Neck is supple. Chest is clear to auscultation without rales or wheezing.      Assessment & Plan:  Acute URI  Plan: Levaquin 500 milligrams daily for 10 days. Tessalon Perles 200 mg 3 times daily as needed for cough

## 2014-12-23 ENCOUNTER — Telehealth: Payer: Self-pay | Admitting: Internal Medicine

## 2014-12-23 NOTE — Telephone Encounter (Signed)
Patient's eyelids are itchy, red and puffy.  She want's to know if there is a prescription that she can take to get rid of it.  She is taking allergy meds but once it wears off, the itchiness, redness and puffiness comes back.  It started over the weekend, and she has been taking the allergy meds for two days now.

## 2014-12-23 NOTE — Telephone Encounter (Signed)
Spoke with patient reviewed instructions for eye care. Patient verbalized understanding

## 2014-12-23 NOTE — Telephone Encounter (Signed)
Can try OTC antihistamine and 1% hydrocortisone cream. Sounds like contact dermatitis or possibly rosacea. May want to see eye physician.

## 2015-02-03 ENCOUNTER — Telehealth: Payer: Self-pay

## 2015-02-03 NOTE — Telephone Encounter (Signed)
Erroneous Encounter

## 2015-02-23 ENCOUNTER — Other Ambulatory Visit: Payer: BLUE CROSS/BLUE SHIELD | Admitting: Internal Medicine

## 2015-02-23 DIAGNOSIS — Z13220 Encounter for screening for lipoid disorders: Secondary | ICD-10-CM

## 2015-02-23 DIAGNOSIS — Z130 Encounter for screening for diseases of the blood and blood-forming organs and certain disorders involving the immune mechanism: Secondary | ICD-10-CM

## 2015-02-23 DIAGNOSIS — Z0000 Encounter for general adult medical examination without abnormal findings: Secondary | ICD-10-CM

## 2015-02-23 DIAGNOSIS — Z1321 Encounter for screening for nutritional disorder: Secondary | ICD-10-CM

## 2015-02-23 DIAGNOSIS — Z1329 Encounter for screening for other suspected endocrine disorder: Secondary | ICD-10-CM

## 2015-02-23 LAB — CBC WITH DIFFERENTIAL/PLATELET
BASOS PCT: 1 % (ref 0–1)
Basophils Absolute: 0 10*3/uL (ref 0.0–0.1)
EOS ABS: 0.1 10*3/uL (ref 0.0–0.7)
EOS PCT: 2 % (ref 0–5)
HEMATOCRIT: 41.2 % (ref 36.0–46.0)
Hemoglobin: 14.5 g/dL (ref 12.0–15.0)
LYMPHS PCT: 33 % (ref 12–46)
Lymphs Abs: 1.6 10*3/uL (ref 0.7–4.0)
MCH: 31.9 pg (ref 26.0–34.0)
MCHC: 35.2 g/dL (ref 30.0–36.0)
MCV: 90.5 fL (ref 78.0–100.0)
MONO ABS: 0.2 10*3/uL (ref 0.1–1.0)
MPV: 9.8 fL (ref 8.6–12.4)
Monocytes Relative: 5 % (ref 3–12)
Neutro Abs: 2.8 10*3/uL (ref 1.7–7.7)
Neutrophils Relative %: 59 % (ref 43–77)
Platelets: 219 10*3/uL (ref 150–400)
RBC: 4.55 MIL/uL (ref 3.87–5.11)
RDW: 13.2 % (ref 11.5–15.5)
WBC: 4.8 10*3/uL (ref 4.0–10.5)

## 2015-02-23 LAB — LIPID PANEL
CHOLESTEROL: 173 mg/dL (ref 125–200)
HDL: 56 mg/dL (ref >=46)
LDL CALC: 99 mg/dL (ref <130)
TRIGLYCERIDES: 89 mg/dL (ref <150)
Total CHOL/HDL Ratio: 3.1 Ratio (ref <=5.0)
VLDL: 18 mg/dL (ref <30)

## 2015-02-23 LAB — COMPLETE METABOLIC PANEL WITH GFR
ALBUMIN: 4.2 g/dL (ref 3.6–5.1)
ALT: 13 U/L (ref 6–29)
AST: 18 U/L (ref 10–35)
Alkaline Phosphatase: 116 U/L (ref 33–130)
BUN: 12 mg/dL (ref 7–25)
CO2: 27 mmol/L (ref 20–31)
CREATININE: 0.88 mg/dL (ref 0.50–1.05)
Calcium: 9.4 mg/dL (ref 8.6–10.4)
Chloride: 105 mmol/L (ref 98–110)
GFR, EST AFRICAN AMERICAN: 87 mL/min (ref >=60)
GFR, EST NON AFRICAN AMERICAN: 75 mL/min (ref >=60)
GLUCOSE: 82 mg/dL (ref 65–99)
POTASSIUM: 4.2 mmol/L (ref 3.5–5.3)
SODIUM: 142 mmol/L (ref 135–146)
Total Bilirubin: 0.8 mg/dL (ref 0.2–1.2)
Total Protein: 6.1 g/dL (ref 6.1–8.1)

## 2015-02-23 LAB — TSH: TSH: 0.78 u[IU]/mL (ref 0.350–4.500)

## 2015-02-24 ENCOUNTER — Ambulatory Visit (INDEPENDENT_AMBULATORY_CARE_PROVIDER_SITE_OTHER): Payer: BLUE CROSS/BLUE SHIELD | Admitting: Internal Medicine

## 2015-02-24 VITALS — BP 118/78 | HR 68 | Temp 98.0°F | Ht 65.0 in | Wt 135.5 lb

## 2015-02-24 DIAGNOSIS — Z87891 Personal history of nicotine dependence: Secondary | ICD-10-CM

## 2015-02-24 DIAGNOSIS — B009 Herpesviral infection, unspecified: Secondary | ICD-10-CM | POA: Diagnosis not present

## 2015-02-24 DIAGNOSIS — I10 Essential (primary) hypertension: Secondary | ICD-10-CM | POA: Diagnosis not present

## 2015-02-24 DIAGNOSIS — Z720 Tobacco use: Secondary | ICD-10-CM

## 2015-02-24 DIAGNOSIS — Z0000 Encounter for general adult medical examination without abnormal findings: Secondary | ICD-10-CM

## 2015-02-24 LAB — VITAMIN D 25 HYDROXY (VIT D DEFICIENCY, FRACTURES): Vit D, 25-Hydroxy: 40 ng/mL (ref 30–100)

## 2015-02-24 LAB — POCT URINALYSIS DIPSTICK
Bilirubin, UA: NEGATIVE
Glucose, UA: NEGATIVE
KETONES UA: NEGATIVE
Leukocytes, UA: NEGATIVE
NITRITE UA: NEGATIVE
PH UA: 7
PROTEIN UA: NEGATIVE
RBC UA: NEGATIVE
Spec Grav, UA: <=1.005
UROBILINOGEN UA: NEGATIVE

## 2015-02-24 MED ORDER — ACYCLOVIR 5 % EX OINT: TOPICAL_OINTMENT | CUTANEOUS | Status: DC | PRN

## 2015-02-24 MED ORDER — LOSARTAN POTASSIUM 25 MG PO TABS: 25.0000 mg | ORAL_TABLET | Freq: Every day | ORAL | Status: DC

## 2015-03-03 ENCOUNTER — Encounter: Payer: Self-pay | Admitting: Internal Medicine

## 2015-04-12 ENCOUNTER — Encounter: Payer: Self-pay | Admitting: Internal Medicine

## 2015-04-12 NOTE — Patient Instructions (Addendum)
Please consider stopping smoking. Continue same medications and return in one year or as needed. Blood pressure under excellent control.

## 2015-04-12 NOTE — Progress Notes (Signed)
   Subjective:    Patient ID: Connie Monroe, female    DOB: [birth year], 54 y.o.   MRN: [id number]  HPI 54 year old White Female in today for health maintenance exam and evaluation of medical problems. History of essential hypertension treated with losartan. History of Herpes simplex type I treated with topical acyclovir. History of anxiety and smoking.  History of LVH on EKG.  Dr. Dory Horn is GYN physician. She had uterine ablation July 2007. Uses topical asked her in.  Social history: She is married. Does not work outside the home. She is a former Teaching laboratory technician. Social alcohol consumption. She is smoked for well over 25 years just a few cigarettes a day. She has 3 children, daughter and 2 sons.  Family history: Father with history of diabetes. One sister with epilepsy. Maternal grandmother with history of coronary artery disease and hypertension.  Had colonoscopy by Dr. Collene Mares November 2014 which was normal.    Review of Systems  Constitutional: Negative.   All other systems reviewed and are negative.      Objective:   Physical Exam  Constitutional: She is oriented to person, place, and time. She appears well-developed and well-nourished. No distress.  HENT:  Head: Normocephalic and atraumatic.  Right Ear: External ear normal.  Left Ear: External ear normal.  Nose: Nose normal.  Mouth/Throat: Oropharynx is clear and moist.  Eyes: Conjunctivae and EOM are normal. Pupils are equal, round, and reactive to light. Right eye exhibits no discharge. Left eye exhibits no discharge. No scleral icterus.  Neck: Neck supple. No JVD present. No thyromegaly present.  Cardiovascular: Normal rate, regular rhythm, normal heart sounds and intact distal pulses.   No murmur heard. Pulmonary/Chest: Effort normal and breath sounds normal. No respiratory distress. She has no wheezes. She has no rales.  Abdominal: Soft. Bowel sounds are normal. She exhibits no distension and no mass.  There is no tenderness. There is no rebound and no guarding.  Genitourinary:  Deferred to GYN physician  Musculoskeletal: She exhibits no edema.  Lymphadenopathy:    She has no cervical adenopathy.  Neurological: She is alert and oriented to person, place, and time. She has normal reflexes. She displays normal reflexes. No cranial nerve deficit.  Skin: Skin is warm and dry. No rash noted. She is not diaphoretic.  Psychiatric: She has a normal mood and affect. Her behavior is normal. Judgment and thought content normal.  Vitals reviewed.         Assessment & Plan:  Normal health maintenance exam  Essential hypertension-stable on low-dose Cozaar  History of herpes simplex type I treated with topical acyclovir  History of topical Estridge and replacement  History of smoking-asked patient to discontinue smoking  History of anxiety-not on anti-anxiety medication  Plan: Return in one year or as needed. Continue same medications.

## 2016-06-20 ENCOUNTER — Telehealth: Payer: Self-pay | Admitting: Internal Medicine

## 2016-06-20 DIAGNOSIS — B009 Herpesviral infection, unspecified: Secondary | ICD-10-CM

## 2016-06-20 MED ORDER — ACYCLOVIR 5 % EX OINT: TOPICAL_OINTMENT | CUTANEOUS | 99 refills | Status: DC | PRN

## 2016-06-20 NOTE — Telephone Encounter (Signed)
Refill Zovirax ointment

## 2016-06-20 NOTE — Telephone Encounter (Signed)
Patient is calling and requesting a Rx for fever blister.  States that she has had this filled in the past by you.     Booked her 2018 physical today as well for January.    Pharmacy:  [facility] @ [place]

## 2016-06-20 NOTE — Telephone Encounter (Signed)
Zovirax cream sent to pharmacy.  Patient informed.

## 2016-08-04 ENCOUNTER — Other Ambulatory Visit: Payer: BLUE CROSS/BLUE SHIELD | Admitting: Internal Medicine

## 2016-08-05 ENCOUNTER — Other Ambulatory Visit: Payer: BLUE CROSS/BLUE SHIELD | Admitting: Internal Medicine

## 2016-08-05 DIAGNOSIS — Z0000 Encounter for general adult medical examination without abnormal findings: Secondary | ICD-10-CM

## 2016-08-05 DIAGNOSIS — I10 Essential (primary) hypertension: Secondary | ICD-10-CM

## 2016-08-05 LAB — COMPREHENSIVE METABOLIC PANEL
ALK PHOS: 117 U/L (ref 33–130)
ALT: 16 U/L (ref 6–29)
AST: 20 U/L (ref 10–35)
Albumin: 4.2 g/dL (ref 3.6–5.1)
BUN: 12 mg/dL (ref 7–25)
CO2: 23 mmol/L (ref 20–31)
CREATININE: 0.99 mg/dL (ref 0.50–1.05)
Calcium: 9.1 mg/dL (ref 8.6–10.4)
Chloride: 106 mmol/L (ref 98–110)
Glucose, Bld: 91 mg/dL (ref 65–99)
Potassium: 4.1 mmol/L (ref 3.5–5.3)
Sodium: 141 mmol/L (ref 135–146)
Total Bilirubin: 0.6 mg/dL (ref 0.2–1.2)
Total Protein: 6.3 g/dL (ref 6.1–8.1)

## 2016-08-05 LAB — CBC WITH DIFFERENTIAL/PLATELET
BASOS PCT: 1 %
Basophils Absolute: 45 cells/uL (ref 0–200)
Eosinophils Absolute: 90 cells/uL (ref 15–500)
Eosinophils Relative: 2 %
HCT: 42.7 % (ref 35.0–45.0)
Hemoglobin: 14.6 g/dL (ref 11.7–15.5)
Lymphocytes Relative: 34 %
Lymphs Abs: 1530 cells/uL (ref 850–3900)
MCH: 31.7 pg (ref 27.0–33.0)
MCHC: 34.2 g/dL (ref 32.0–36.0)
MCV: 92.8 fL (ref 80.0–100.0)
MONOS PCT: 6 %
MPV: 9.8 fL (ref 7.5–12.5)
Monocytes Absolute: 270 cells/uL (ref 200–950)
NEUTROS ABS: 2565 {cells}/uL (ref 1500–7800)
Neutrophils Relative %: 57 %
PLATELETS: 236 10*3/uL (ref 140–400)
RBC: 4.6 MIL/uL (ref 3.80–5.10)
RDW: 13.1 % (ref 11.0–15.0)
WBC: 4.5 10*3/uL (ref 3.8–10.8)

## 2016-08-05 LAB — LIPID PANEL
Cholesterol: 192 mg/dL (ref <200)
HDL: 64 mg/dL (ref >50)
LDL Cholesterol: 112 mg/dL — ABNORMAL HIGH (ref <100)
Total CHOL/HDL Ratio: 3 Ratio (ref <5.0)
Triglycerides: 78 mg/dL (ref <150)
VLDL: 16 mg/dL (ref <30)

## 2016-08-05 LAB — TSH: TSH: 0.69 mIU/L

## 2016-08-05 NOTE — Progress Notes (Signed)
Lab only 

## 2016-08-08 ENCOUNTER — Ambulatory Visit (INDEPENDENT_AMBULATORY_CARE_PROVIDER_SITE_OTHER): Payer: BLUE CROSS/BLUE SHIELD | Admitting: Internal Medicine

## 2016-08-08 ENCOUNTER — Encounter: Payer: Self-pay | Admitting: Internal Medicine

## 2016-08-08 VITALS — BP 140/80 | HR 68 | Ht 65.0 in | Wt 138.8 lb

## 2016-08-08 DIAGNOSIS — Z87891 Personal history of nicotine dependence: Secondary | ICD-10-CM | POA: Diagnosis not present

## 2016-08-08 DIAGNOSIS — Z0000 Encounter for general adult medical examination without abnormal findings: Secondary | ICD-10-CM

## 2016-08-08 DIAGNOSIS — I10 Essential (primary) hypertension: Secondary | ICD-10-CM

## 2016-08-08 DIAGNOSIS — B009 Herpesviral infection, unspecified: Secondary | ICD-10-CM

## 2016-08-08 LAB — POC URINALSYSI DIPSTICK (AUTOMATED)
BILIRUBIN UA: NEGATIVE
Glucose, UA: NEGATIVE
KETONES UA: NEGATIVE
Leukocytes, UA: NEGATIVE
Nitrite, UA: NEGATIVE
PH UA: 6.5
PROTEIN UA: NEGATIVE
SPEC GRAV UA: 1.01
Urobilinogen, UA: NEGATIVE

## 2016-08-08 MED ORDER — VALACYCLOVIR HCL 500 MG PO TABS: 500.0000 mg | ORAL_TABLET | Freq: Two times a day (BID) | ORAL | 3 refills | Status: DC

## 2016-08-08 NOTE — Progress Notes (Signed)
   Subjective:    Patient ID: Connie Monroe, female    DOB: August 22, 1960, 56 y.o.   MRN: [id number]  HPI 56 year old female in today for health maintenance exam and evaluation of medical issues. History of essential hypertension treated with losartan. History of herpes simplex type I. History of anxiety and smoking.  EKG done in 2013 for evaluation of elevated blood pressure showed LVH.  She had a uterine ablation July 2007. Dr. Dory Horn is GYN physician. She uses topical estrogen cream.  Social history: She is married. Does not work outside of the home. She is a former Teaching laboratory technician. She has smoked for well over 25 years just a few cigarettes daily. She has 3 children, a daughter and 2 sons. Social alcohol consumption.  Family history: Father with history of diabetes. One sister with epilepsy. Maternal grandmother with history coronary artery disease and hypertension.  Patient had colonoscopy by Dr. Collene Mares November 2014 which was normal.      Review of Systems  Constitutional: Negative.   All other systems reviewed and are negative.      Objective:   Physical Exam  Constitutional: She is oriented to person, place, and time. She appears well-developed and well-nourished. No distress.  HENT:  Head: Normocephalic and atraumatic.  Right Ear: External ear normal.  Left Ear: External ear normal.  Mouth/Throat: Oropharynx is clear and moist.  Eyes: Conjunctivae and EOM are normal. Pupils are equal, round, and reactive to light. Right eye exhibits no discharge. Left eye exhibits no discharge.  Neck: Neck supple. No JVD present. No thyromegaly present.  Cardiovascular: Normal rate, regular rhythm, normal heart sounds and intact distal pulses.   No murmur heard. Pulmonary/Chest: No respiratory distress. She has no wheezes. She has no rales. She exhibits no tenderness.  Breasts normal female without masses  Abdominal: She exhibits no distension. There is no tenderness.  There is no rebound and no guarding.  Genitourinary:  Genitourinary Comments: Deferred to GYN physician  Musculoskeletal: She exhibits no edema.  Lymphadenopathy:    She has no cervical adenopathy.  Neurological: She is alert and oriented to person, place, and time. She has normal reflexes. No cranial nerve deficit.  Skin: Skin is warm and dry. She is not diaphoretic.  Psychiatric: She has a normal mood and affect. Her behavior is normal. Judgment and thought content normal.  Vitals reviewed.         Assessment & Plan:  Essential hypertension-refill losartan  History of smoking  History of Herpes simplex type I-treated with Valtrex  History of estrogen replacement topically-treated by GYN take losartan daily and return in one year or as needed.  History of anxiety-not on antianxiety medication  Plan: Monitor blood pressure at home. Please call with blood pressure readings in a couple of weeks please. Want blood pressure to be 120-130/80.

## 2016-08-08 NOTE — Progress Notes (Signed)
   Subjective:    Patient ID: Connie Monroe, female    DOB: October 10, 1960, 56 y.o.   MRN: [id number]  HPI    Review of Systems     Objective:   Physical Exam        Assessment & Plan:

## 2016-08-17 ENCOUNTER — Encounter: Payer: Self-pay | Admitting: Internal Medicine

## 2016-08-17 NOTE — Patient Instructions (Signed)
It was pleasure to see you today. Continue losartan. Valtrex prescribed for HSV type I.

## 2016-09-15 ENCOUNTER — Ambulatory Visit (INDEPENDENT_AMBULATORY_CARE_PROVIDER_SITE_OTHER): Payer: BLUE CROSS/BLUE SHIELD | Admitting: Internal Medicine

## 2016-09-15 ENCOUNTER — Other Ambulatory Visit: Payer: Self-pay | Admitting: Internal Medicine

## 2016-09-15 VITALS — BP 140/110 | HR 74 | Temp 98.9°F

## 2016-09-15 DIAGNOSIS — J029 Acute pharyngitis, unspecified: Secondary | ICD-10-CM | POA: Diagnosis not present

## 2016-09-15 DIAGNOSIS — R030 Elevated blood-pressure reading, without diagnosis of hypertension: Secondary | ICD-10-CM

## 2016-09-15 DIAGNOSIS — I10 Essential (primary) hypertension: Secondary | ICD-10-CM | POA: Diagnosis not present

## 2016-09-15 DIAGNOSIS — J0100 Acute maxillary sinusitis, unspecified: Secondary | ICD-10-CM | POA: Diagnosis not present

## 2016-09-15 DIAGNOSIS — R52 Pain, unspecified: Secondary | ICD-10-CM

## 2016-09-15 LAB — POCT RAPID STREP A (OFFICE): Rapid Strep A Screen: NEGATIVE

## 2016-09-15 MED ORDER — LOSARTAN POTASSIUM 25 MG PO TABS: 25.0000 mg | ORAL_TABLET | Freq: Every day | ORAL | 3 refills | Status: DC

## 2016-09-15 MED ORDER — AZITHROMYCIN 250 MG PO TABS: ORAL_TABLET | ORAL | 0 refills | Status: DC

## 2016-09-15 MED ORDER — HYDROCODONE-HOMATROPINE 5-1.5 MG/5ML PO SYRP: 5.0000 mL | ORAL_SOLUTION | Freq: Three times a day (TID) | ORAL | 0 refills | Status: DC | PRN

## 2016-09-20 ENCOUNTER — Encounter: Payer: Self-pay | Admitting: Internal Medicine

## 2016-09-20 NOTE — Patient Instructions (Addendum)
Take Zithromax Z-PAK as directed and Hycodan as needed for cough. Rest and drink plenty of fluids. Have blood pressure medication refilled and take it regularly

## 2016-09-20 NOTE — Progress Notes (Signed)
   Subjective:    Patient ID: Connie Monroe, female    DOB: [birth year], 56 y.o.   MRN: [id number]  HPI 56 year old Female whose general health as excellent recently came down with sore throat and maxillary sinus pressure and congestion. Has had malaise and fatigue.    Review of Systems     Objective:   Physical Exam Pharynx slightly injected. TMs are clear. Neck is supple without adenopathy. Chest clear to auscultation without rales or wheezing. Rapid strep screen is negative. Influenza A and B nasal swabs sent to lab.       Assessment & Plan:  Probable influenza-really too late to start Tamiflu. Symptoms of been going on in excess of 48 hours.  Acute sinusitis  Essential hypertension-has run out of antihypertensive medication and needs to have it refilled and start taking it regularly  Plan: Zithromax Z-PAK take 2 tablets day 1 followed by 1 tablet days 2 through 5. Hycodan 1 teaspoon by mouth every 8 hours when necessary cough.

## 2016-09-21 ENCOUNTER — Telehealth: Payer: Self-pay | Admitting: Internal Medicine

## 2016-09-21 NOTE — Telephone Encounter (Signed)
Test received from [facility] that flu test was negative.  Called patient to advise; received voice mail; left message.  Patient instructed on voice mail that flu test was negative and hope that she is feeling much better by now.  Patient instructed to call the office if she has questions or if she is not feeling better.

## 2016-09-24 LAB — INFLUENZA A & B PCR
INFLUENZA A RNA, PCR: NOT DETECTED
INFLUENZA B RNA, PCR: NOT DETECTED

## 2016-11-08 ENCOUNTER — Encounter: Payer: Self-pay | Admitting: Internal Medicine

## 2016-11-08 ENCOUNTER — Ambulatory Visit (INDEPENDENT_AMBULATORY_CARE_PROVIDER_SITE_OTHER): Payer: 59 | Admitting: Internal Medicine

## 2016-11-08 VITALS — BP 110/84 | HR 81 | Temp 98.8°F | Wt 138.0 lb

## 2016-11-08 DIAGNOSIS — J069 Acute upper respiratory infection, unspecified: Secondary | ICD-10-CM | POA: Diagnosis not present

## 2016-11-08 MED ORDER — HYDROCODONE-HOMATROPINE 5-1.5 MG/5ML PO SYRP: 5.0000 mL | ORAL_SOLUTION | Freq: Three times a day (TID) | ORAL | 0 refills | Status: DC | PRN

## 2016-11-08 MED ORDER — AZITHROMYCIN 250 MG PO TABS: ORAL_TABLET | ORAL | 0 refills | Status: DC

## 2016-11-08 MED ORDER — METHYLPREDNISOLONE ACETATE 80 MG/ML IJ SUSP
80.0000 mg | Freq: Once | INTRAMUSCULAR | Status: AC
  Administered 2016-11-08: 80 mg via INTRAMUSCULAR

## 2016-11-08 NOTE — Patient Instructions (Signed)
Depo-Medrol 80 mg IM. Zithromax Z-PAK take as directed. Hycodan 1 teaspoon by mouth every 8 hours when necessary cough. Rest and drink plenty of fluids.

## 2016-11-08 NOTE — Progress Notes (Signed)
   Subjective:    Patient ID: Connie Monroe, female    DOB: Aug 27, 1960, 56 y.o.   MRN: [id number]  HPI She was here March 1 with a respiratory infection that responded to Hycodan and Zithromax. About a week ago, she returned from seeing her daughter in [place]. She came down with another respiratory infection. She's had cough and congestion but no fever or shaking chills. She's been working in her yard a lot and exposed to a lot of pollen. She's having an event at her house on Friday, April 27.  She's had a scratchy throat.    Review of Systems see above     Objective:   Physical Exam TMs and pharynx are clear. Neck is supple without significant adenopathy. Chest clear to auscultation. She sounds nasally congested when she speaks.       Assessment & Plan:  Acute URI  Plan: Zithromax Z-PAK take 2 tablets day one followed by 1 tablet days 2 through 5. Hycodan 1 teaspoon by mouth every 8 hours when necessary cough. Depo-Medrol 80 mg IM given for congestion. Rest and drink plenty of fluids.

## 2016-12-06 DIAGNOSIS — L570 Actinic keratosis: Secondary | ICD-10-CM | POA: Diagnosis not present

## 2016-12-13 DIAGNOSIS — Z01419 Encounter for gynecological examination (general) (routine) without abnormal findings: Secondary | ICD-10-CM | POA: Diagnosis not present

## 2016-12-13 DIAGNOSIS — Z6823 Body mass index (BMI) 23.0-23.9, adult: Secondary | ICD-10-CM | POA: Diagnosis not present

## 2017-05-08 ENCOUNTER — Encounter: Payer: Self-pay | Admitting: Internal Medicine

## 2017-05-08 ENCOUNTER — Ambulatory Visit (INDEPENDENT_AMBULATORY_CARE_PROVIDER_SITE_OTHER): Payer: 59 | Admitting: Internal Medicine

## 2017-05-08 VITALS — BP 120/86 | HR 91 | Temp 98.0°F | Ht 65.0 in | Wt 141.1 lb

## 2017-05-08 DIAGNOSIS — J069 Acute upper respiratory infection, unspecified: Secondary | ICD-10-CM | POA: Diagnosis not present

## 2017-05-08 MED ORDER — AZITHROMYCIN 250 MG PO TABS: ORAL_TABLET | ORAL | 1 refills | Status: DC

## 2017-05-08 MED ORDER — AZITHROMYCIN 250 MG PO TABS: ORAL_TABLET | ORAL | 0 refills | Status: DC

## 2017-05-08 MED ORDER — HYDROCODONE-HOMATROPINE 5-1.5 MG/5ML PO SYRP: 5.0000 mL | ORAL_SOLUTION | Freq: Three times a day (TID) | ORAL | 0 refills | Status: DC | PRN

## 2017-05-08 NOTE — Patient Instructions (Signed)
Zithromax Z-PAK take 2 tablets day 1 followed by 1 tablet days 2 through 5.  Hycodan 1 teaspoon p.o. every 8 hours as needed cough.  Mucinex as directed over-the-counter.

## 2017-05-08 NOTE — Progress Notes (Signed)
   Subjective:    Patient ID: Connie Monroe, female    DOB: [birth year], 56 y.o.   MRN: [id number]  HPI 56 year old Female recently traveled to [place] and subsequently on to [place] to see her daughter.  She returned to [place] ill with a respiratory infection.  She developed a sore throat while in [place], [place].  No fever or shaking chills.  Postnasal drip with  need to clear her throat.               Review of Systems  See above    Objective:   Physical Exam Skin warm and dry.  Nodes none.  Pharynx injected without exudate.  TMs are clear bilaterally.  Neck is supple without adenopathy.  Chest clear to auscultation.       Assessment & Plan:  Acute upper respiratory infection  Plan: Zithromax Z-Pak take 2 tablets p.o. day 1 followed by 1 tablet p.o. days 2 through 5 with 1 refill.  She will have a refill for travel in the future.  Hycodan 1 teaspoon p.o. every 8 hours as needed cough.  Obtain Mucinex over-the-counter and take as directed.

## 2017-07-23 DIAGNOSIS — Z23 Encounter for immunization: Secondary | ICD-10-CM | POA: Diagnosis not present

## 2017-09-01 ENCOUNTER — Other Ambulatory Visit: Payer: Self-pay | Admitting: Internal Medicine

## 2017-09-19 DIAGNOSIS — D229 Melanocytic nevi, unspecified: Secondary | ICD-10-CM | POA: Diagnosis not present

## 2017-09-19 DIAGNOSIS — C44529 Squamous cell carcinoma of skin of other part of trunk: Secondary | ICD-10-CM | POA: Diagnosis not present

## 2017-09-19 DIAGNOSIS — L910 Hypertrophic scar: Secondary | ICD-10-CM | POA: Diagnosis not present

## 2017-09-19 DIAGNOSIS — C4492 Squamous cell carcinoma of skin, unspecified: Secondary | ICD-10-CM

## 2017-09-19 DIAGNOSIS — L570 Actinic keratosis: Secondary | ICD-10-CM | POA: Diagnosis not present

## 2017-09-19 HISTORY — DX: Squamous cell carcinoma of skin, unspecified: C44.92

## 2017-10-04 DIAGNOSIS — C44529 Squamous cell carcinoma of skin of other part of trunk: Secondary | ICD-10-CM | POA: Diagnosis not present

## 2017-11-01 DIAGNOSIS — C44521 Squamous cell carcinoma of skin of breast: Secondary | ICD-10-CM | POA: Diagnosis not present

## 2017-11-01 DIAGNOSIS — C44529 Squamous cell carcinoma of skin of other part of trunk: Secondary | ICD-10-CM | POA: Diagnosis not present

## 2017-11-14 DIAGNOSIS — L570 Actinic keratosis: Secondary | ICD-10-CM | POA: Diagnosis not present

## 2017-11-14 DIAGNOSIS — D485 Neoplasm of uncertain behavior of skin: Secondary | ICD-10-CM | POA: Diagnosis not present

## 2017-11-14 DIAGNOSIS — L814 Other melanin hyperpigmentation: Secondary | ICD-10-CM | POA: Diagnosis not present

## 2017-11-14 DIAGNOSIS — D225 Melanocytic nevi of trunk: Secondary | ICD-10-CM | POA: Diagnosis not present

## 2017-11-14 DIAGNOSIS — L905 Scar conditions and fibrosis of skin: Secondary | ICD-10-CM | POA: Diagnosis not present

## 2017-12-21 DIAGNOSIS — Z13820 Encounter for screening for osteoporosis: Secondary | ICD-10-CM | POA: Diagnosis not present

## 2017-12-21 DIAGNOSIS — Z01419 Encounter for gynecological examination (general) (routine) without abnormal findings: Secondary | ICD-10-CM | POA: Diagnosis not present

## 2017-12-21 DIAGNOSIS — Z6822 Body mass index (BMI) 22.0-22.9, adult: Secondary | ICD-10-CM | POA: Diagnosis not present

## 2018-03-07 DIAGNOSIS — Z1231 Encounter for screening mammogram for malignant neoplasm of breast: Secondary | ICD-10-CM | POA: Diagnosis not present

## 2018-04-23 ENCOUNTER — Other Ambulatory Visit: Payer: Self-pay | Admitting: Internal Medicine

## 2018-04-24 ENCOUNTER — Other Ambulatory Visit: Payer: Self-pay

## 2018-04-24 MED ORDER — LOSARTAN POTASSIUM 25 MG PO TABS: 25.0000 mg | ORAL_TABLET | Freq: Every day | ORAL | 0 refills | Status: DC

## 2018-07-29 DIAGNOSIS — R69 Illness, unspecified: Secondary | ICD-10-CM | POA: Diagnosis not present

## 2018-08-05 ENCOUNTER — Other Ambulatory Visit: Payer: Self-pay | Admitting: Internal Medicine

## 2018-08-13 ENCOUNTER — Other Ambulatory Visit: Payer: 59 | Admitting: Internal Medicine

## 2018-08-13 DIAGNOSIS — B009 Herpesviral infection, unspecified: Secondary | ICD-10-CM

## 2018-08-13 DIAGNOSIS — Z1329 Encounter for screening for other suspected endocrine disorder: Secondary | ICD-10-CM

## 2018-08-13 DIAGNOSIS — E7800 Pure hypercholesterolemia, unspecified: Secondary | ICD-10-CM | POA: Diagnosis not present

## 2018-08-13 DIAGNOSIS — Z0000 Encounter for general adult medical examination without abnormal findings: Secondary | ICD-10-CM

## 2018-08-13 DIAGNOSIS — I10 Essential (primary) hypertension: Secondary | ICD-10-CM

## 2018-08-14 ENCOUNTER — Ambulatory Visit (INDEPENDENT_AMBULATORY_CARE_PROVIDER_SITE_OTHER): Payer: 59 | Admitting: Internal Medicine

## 2018-08-14 ENCOUNTER — Encounter: Payer: Self-pay | Admitting: Internal Medicine

## 2018-08-14 VITALS — BP 120/88 | HR 88 | Temp 98.3°F | Ht 65.0 in | Wt 132.0 lb

## 2018-08-14 DIAGNOSIS — I10 Essential (primary) hypertension: Secondary | ICD-10-CM

## 2018-08-14 DIAGNOSIS — Z0000 Encounter for general adult medical examination without abnormal findings: Secondary | ICD-10-CM

## 2018-08-14 DIAGNOSIS — R05 Cough: Secondary | ICD-10-CM

## 2018-08-14 DIAGNOSIS — B009 Herpesviral infection, unspecified: Secondary | ICD-10-CM | POA: Diagnosis not present

## 2018-08-14 DIAGNOSIS — R053 Chronic cough: Secondary | ICD-10-CM

## 2018-08-14 LAB — POCT URINALYSIS DIPSTICK
Appearance: NEGATIVE
BILIRUBIN UA: NEGATIVE
GLUCOSE UA: NEGATIVE
KETONES UA: NEGATIVE
Leukocytes, UA: NEGATIVE
Nitrite, UA: NEGATIVE
Odor: NEGATIVE
PH UA: 6.5 (ref 5.0–8.0)
Protein, UA: NEGATIVE
RBC UA: NEGATIVE
SPEC GRAV UA: 1.01 (ref 1.010–1.025)
UROBILINOGEN UA: 0.2 U/dL

## 2018-08-14 LAB — CBC WITH DIFFERENTIAL/PLATELET
ABSOLUTE MONOCYTES: 330 {cells}/uL (ref 200–950)
BASOS ABS: 22 {cells}/uL (ref 0–200)
Basophils Relative: 0.4 %
EOS ABS: 88 {cells}/uL (ref 15–500)
EOS PCT: 1.6 %
HEMATOCRIT: 40.7 % (ref 35.0–45.0)
HEMOGLOBIN: 13.9 g/dL (ref 11.7–15.5)
LYMPHS ABS: 1359 {cells}/uL (ref 850–3900)
MCH: 31.8 pg (ref 27.0–33.0)
MCHC: 34.2 g/dL (ref 32.0–36.0)
MCV: 93.1 fL (ref 80.0–100.0)
MPV: 10.5 fL (ref 7.5–12.5)
Monocytes Relative: 6 %
NEUTROS ABS: 3702 {cells}/uL (ref 1500–7800)
Neutrophils Relative %: 67.3 %
Platelets: 303 10*3/uL (ref 140–400)
RBC: 4.37 10*6/uL (ref 3.80–5.10)
RDW: 11.9 % (ref 11.0–15.0)
Total Lymphocyte: 24.7 %
WBC: 5.5 10*3/uL (ref 3.8–10.8)

## 2018-08-14 LAB — COMPLETE METABOLIC PANEL WITH GFR
AG RATIO: 2.3 (calc) (ref 1.0–2.5)
ALBUMIN MSPROF: 4.4 g/dL (ref 3.6–5.1)
ALT: 16 U/L (ref 6–29)
AST: 15 U/L (ref 10–35)
Alkaline phosphatase (APISO): 132 U/L — ABNORMAL HIGH (ref 33–130)
BUN: 13 mg/dL (ref 7–25)
CALCIUM: 9.2 mg/dL (ref 8.6–10.4)
CO2: 27 mmol/L (ref 20–32)
Chloride: 107 mmol/L (ref 98–110)
Creat: 0.96 mg/dL (ref 0.50–1.05)
GFR, EST AFRICAN AMERICAN: 77 mL/min/{1.73_m2} (ref >=60)
GFR, EST NON AFRICAN AMERICAN: 66 mL/min/{1.73_m2} (ref >=60)
GLOBULIN: 1.9 g/dL (ref 1.9–3.7)
Glucose, Bld: 89 mg/dL (ref 65–99)
POTASSIUM: 4.2 mmol/L (ref 3.5–5.3)
SODIUM: 141 mmol/L (ref 135–146)
Total Bilirubin: 0.6 mg/dL (ref 0.2–1.2)
Total Protein: 6.3 g/dL (ref 6.1–8.1)

## 2018-08-14 LAB — LIPID PANEL
Cholesterol: 190 mg/dL (ref <200)
HDL: 60 mg/dL (ref >50)
LDL Cholesterol (Calc): 109 mg/dL (calc) — ABNORMAL HIGH
Non-HDL Cholesterol (Calc): 130 mg/dL (calc) — ABNORMAL HIGH (ref <130)
Total CHOL/HDL Ratio: 3.2 (calc) (ref <5.0)
Triglycerides: 99 mg/dL (ref <150)

## 2018-08-14 LAB — TSH: TSH: 0.97 m[IU]/L (ref 0.40–4.50)

## 2018-08-14 LAB — VITAMIN D 25 HYDROXY (VIT D DEFICIENCY, FRACTURES): VIT D 25 HYDROXY: 32 ng/mL (ref 30–100)

## 2018-08-14 MED ORDER — LOSARTAN POTASSIUM 25 MG PO TABS: 25.0000 mg | ORAL_TABLET | Freq: Every day | ORAL | 3 refills | Status: DC

## 2018-08-14 MED ORDER — AZITHROMYCIN 250 MG PO TABS: ORAL_TABLET | ORAL | 0 refills | Status: DC

## 2018-08-14 NOTE — Progress Notes (Signed)
Subjective:    Patient ID: Connie Monroe, female    DOB: March 06, 1961, 58 y.o.   MRN: [id number]  HPI pleasant 58 year old female in today for health maintenance exam and evaluation of medical issues.  She has a history of essential hypertension, history of herpes simplex type I, history of anxiety and smoking.  Recently was on ski trip to [place] and came down with influenza.  Dr. Nori Riis is GYN physician.  She had a uterine ablation July 2007.  EKG done in 2013 for evaluation of elevated blood pressure showed LVH.  Patient had colonoscopy by Dr. Collene Mares November 2014 which was normal.  Social history: She is married.  Does not work outside the home.  She is a former Teaching laboratory technician.  She has smoked for well over 25 years with just a few cigarettes daily.  She has 3 children, a daughter and 2 sons.  Social alcohol consumption.  Family history: Father with history of diabetes.  One sister with history of epilepsy and Down syndrome.  Maternal grandmother with history of coronary artery disease and hypertension.    Review of Systems  Constitutional: Negative.   Respiratory: Positive for cough.        Still has cough status post recent bout of influenza  All other systems reviewed and are negative.      Objective:   Physical Exam Vitals signs reviewed.  Constitutional:      General: She is not in acute distress.    Appearance: Normal appearance. She is not diaphoretic.  HENT:     Head: Normocephalic and atraumatic.     Right Ear: Tympanic membrane normal.     Left Ear: Tympanic membrane normal.     Mouth/Throat:     Mouth: Mucous membranes are moist.     Pharynx: Oropharynx is clear.  Eyes:     General: No scleral icterus.       Right eye: No discharge.        Left eye: No discharge.     Conjunctiva/sclera: Conjunctivae normal.     Pupils: Pupils are equal, round, and reactive to light.  Neck:     Musculoskeletal: No muscular tenderness.     Vascular: No  carotid bruit.  Cardiovascular:     Rate and Rhythm: Normal rate and regular rhythm.     Heart sounds: No murmur.     Comments: Breast bilateral implants without masses Pulmonary:     Effort: Pulmonary effort is normal. No respiratory distress.     Breath sounds: Normal breath sounds. No wheezing.  Abdominal:     General: Bowel sounds are normal. There is no distension.     Palpations: Abdomen is soft. There is no mass.     Tenderness: There is no abdominal tenderness. There is no guarding or rebound.  Genitourinary:    Comments: Deferred to GYN Musculoskeletal:        General: No deformity.     Right lower leg: No edema.  Lymphadenopathy:     Cervical: No cervical adenopathy.  Skin:    General: Skin is warm and dry.     Findings: No rash.  Neurological:     General: No focal deficit present.     Mental Status: She is alert and oriented to person, place, and time.     Cranial Nerves: No cranial nerve deficit.     Coordination: Coordination normal.  Psychiatric:        Mood and Affect: Mood normal.  Behavior: Behavior normal.        Thought Content: Thought content normal.        Judgment: Judgment normal.           Assessment & Plan:  Persistent cough after influenza contracted during trip to [place] recently.  Treat with Zithromax Z-PAK.  May have 1 refill to take with her on upcoming trip to [place].  Essential hypertension-stable on current regimen and followed annually.  She will monitor blood pressure at home  History of smoking-light smoking but needs to quit  History of herpes simplex treated with Valtrex as needed  History of estrogen replacement treated topically by GYN  Plan: Monitor blood pressure at home call if blood pressure is elevated persistently.  Return in 1 year or as needed.

## 2018-08-14 NOTE — Patient Instructions (Signed)
Was a pleasure to see you today.  Zithromax has been prescribed for cough with 1 refill.  Continue losartan for hypertension.  Monitor blood pressure at home.  Return in 1 year or as needed.

## 2018-08-28 ENCOUNTER — Other Ambulatory Visit: Payer: Self-pay

## 2018-08-28 MED ORDER — AZITHROMYCIN 250 MG PO TABS: ORAL_TABLET | ORAL | 0 refills | Status: DC

## 2019-01-24 ENCOUNTER — Telehealth: Payer: Self-pay | Admitting: Internal Medicine

## 2019-01-24 NOTE — Telephone Encounter (Signed)
After speaking with Dr Renold Genta, Scheduled virtual visit

## 2019-01-24 NOTE — Telephone Encounter (Signed)
Khamryn Calderone [phone]  Carlita called to say that her nephew had spent all of last week at the beach with them and he went to the Urgent care on Monday in [place] with what he thought was swimmer's ear, but it turned into fever, aches, pains, chills, he tested positive for COVID-19. Vivika is having no symptoms, but she is wondering what she needs to do, she was with him until Monday. I suggested a virtual visit.

## 2019-01-25 ENCOUNTER — Other Ambulatory Visit: Payer: Self-pay | Admitting: Internal Medicine

## 2019-01-25 ENCOUNTER — Ambulatory Visit (INDEPENDENT_AMBULATORY_CARE_PROVIDER_SITE_OTHER): Payer: 59 | Admitting: Internal Medicine

## 2019-01-25 ENCOUNTER — Other Ambulatory Visit: Payer: Self-pay

## 2019-01-25 DIAGNOSIS — Z20828 Contact with and (suspected) exposure to other viral communicable diseases: Secondary | ICD-10-CM

## 2019-01-25 DIAGNOSIS — Z20822 Contact with and (suspected) exposure to covid-19: Secondary | ICD-10-CM

## 2019-01-25 NOTE — Telephone Encounter (Signed)
Spoke with Louretta Shorten and arranged COVID testing for Dr Renold Genta at the Testing site for after 1:30. Due to exposure to someone with positive COVID results

## 2019-01-28 ENCOUNTER — Other Ambulatory Visit: Payer: Self-pay

## 2019-01-28 ENCOUNTER — Encounter: Payer: Self-pay | Admitting: Internal Medicine

## 2019-01-28 DIAGNOSIS — Z20822 Contact with and (suspected) exposure to covid-19: Secondary | ICD-10-CM

## 2019-01-28 NOTE — Patient Instructions (Signed)
We will arrange for testing at [place] test center at [facility].  Patient aware it may take 3 to 5 days to receive results.  Needs to quarantine until test results are back.

## 2019-01-28 NOTE — Progress Notes (Signed)
   Subjective:    Patient ID: Connie Monroe, female    DOB: Dec 16, 1960, 58 y.o.   MRN: [id number]  HPI 58 year old Female was at beach house recently with family. Nephew from [place] came and stayed at beach home with pt and her family. He left to return home this past Sunday with flu like symptoms. Initially said he thought he had swimmers ear. Then developed fever and myalgias. Subsequently after returning home, he was tested for Covid 19 and was apparently positive according to [facility]. She Is concerned about her family including her sister who has Down syndrome and is been staying with [place] some during the pandemic.  Nanna also has an elderly mother she is concerned about in addition to her husband and 2 sons.  One son is planning to enter the [employer] and does not want to be tested.  Husband and other son have remained at the beach and will be returning home soon and will have virtual visit.  Currently, Marchele feels well except for some anxiety mild fatigue.  Has not had any fever or shaking chills or myalgias.  No cough.  No dysgeusia.  She is seen today by [employer] due to the coronavirus pandemic.  She is identified using 2 identifiers as Connie Monroe, a patient in this practice.  She is agreeable to visit in this format today.    Review of Systems see above     Objective:   Physical Exam  Reports no fever.  Seen virtually.  Looks slightly fatigued.      Assessment & Plan:  COVID-19 exposure  Plan: Arrange for testing at [place] [facility] center at [facility].  Appointment time is today.  15 minutes spent with patient obtaining history, medical decision making, calling testing center.

## 2019-01-31 ENCOUNTER — Telehealth: Payer: Self-pay | Admitting: Internal Medicine

## 2019-01-31 LAB — NOVEL CORONAVIRUS, NAA: SARS-CoV-2, NAA: NOT DETECTED

## 2019-01-31 NOTE — Telephone Encounter (Signed)
Covid 19 test is negative. Left voice mail message at number provided in [facility]

## 2019-02-01 ENCOUNTER — Telehealth: Payer: Self-pay | Admitting: Internal Medicine

## 2019-02-01 NOTE — Telephone Encounter (Signed)
Connie Monroe [phone]  [name] called to say she has a spot on the left side of her chest that is raised, dry area, itchy, its almost like pimple. She has made an appointment to see Karma Lew, but she needs a referral for her insurance. Would you like an office visit?

## 2019-02-01 NOTE — Telephone Encounter (Signed)
Just make the referral please. I do not know who this provider is or with what practice. Can you find out. I do not know how to make the referral either. Is it an appointment or a form that needs to be filled out

## 2019-02-01 NOTE — Telephone Encounter (Signed)
Referral, I will take care of it

## 2019-02-01 NOTE — Telephone Encounter (Signed)
Faxed referral to Dermatology

## 2019-06-11 ENCOUNTER — Telehealth: Payer: Self-pay | Admitting: Internal Medicine

## 2019-06-11 DIAGNOSIS — B009 Herpesviral infection, unspecified: Secondary | ICD-10-CM

## 2019-06-11 MED ORDER — VALACYCLOVIR HCL 500 MG PO TABS: ORAL_TABLET | ORAL | 2 refills | Status: DC

## 2019-06-11 NOTE — Telephone Encounter (Signed)
Connie Monroe [phone]  Latera called to say this morning she woke up with fever blisters and she needs a prescription called in to pharmacy. I let her know we may need virtual visit or that there may be a fee for calling in and old prescription.  [id number] - [place], [place] [phone] (Phone) [phone] (Fax)     valACYclovir (VALTREX) 500 MG tablet

## 2019-06-11 NOTE — Telephone Encounter (Signed)
Please call in Valtrex 500 mg twice a day for 5 days #10 with 2 refills

## 2019-06-17 NOTE — Telephone Encounter (Signed)
Patient has longstanding history of HSV Type 2 infection requiring Valtrex Rx from time to time. Spoke with patient about need for refill today. Call in Valtrex 500 mg twice daily for 5 days #10 with 2 refills. Spoke with patient personally and has been having more issues lately with HSV Type1  [email or web address]

## 2019-06-17 NOTE — Addendum Note (Signed)
Addended by: Elby Showers on: 06/17/2019 05:32 PM   Modules accepted: Level of Service

## 2019-07-19 HISTORY — PX: KNEE SURGERY: SHX244

## 2019-08-22 ENCOUNTER — Other Ambulatory Visit: Payer: 59 | Admitting: Internal Medicine

## 2019-08-23 ENCOUNTER — Encounter: Payer: 59 | Admitting: Internal Medicine

## 2019-08-26 ENCOUNTER — Telehealth: Payer: Self-pay | Admitting: Internal Medicine

## 2019-08-26 MED ORDER — LOSARTAN POTASSIUM 25 MG PO TABS: 25.0000 mg | ORAL_TABLET | Freq: Every day | ORAL | 0 refills | Status: DC

## 2019-08-26 NOTE — Telephone Encounter (Signed)
Received Fax RX request from  [facility], [place] Phone:  [phone]  Fax:  [phone]       Medication - losartan (COZAAR) 25 MG tablet    Last Refill - 07/28/2019  Last OV - 01/25/19  Last CPE - 08/14/18  Next Appointment - 10/07/2019

## 2019-09-30 ENCOUNTER — Other Ambulatory Visit: Payer: Self-pay

## 2019-09-30 ENCOUNTER — Other Ambulatory Visit: Payer: 59 | Admitting: Internal Medicine

## 2019-09-30 DIAGNOSIS — Z1321 Encounter for screening for nutritional disorder: Secondary | ICD-10-CM

## 2019-09-30 DIAGNOSIS — I10 Essential (primary) hypertension: Secondary | ICD-10-CM

## 2019-09-30 DIAGNOSIS — E7800 Pure hypercholesterolemia, unspecified: Secondary | ICD-10-CM

## 2019-09-30 DIAGNOSIS — Z1329 Encounter for screening for other suspected endocrine disorder: Secondary | ICD-10-CM

## 2019-09-30 DIAGNOSIS — Z13220 Encounter for screening for lipoid disorders: Secondary | ICD-10-CM

## 2019-09-30 DIAGNOSIS — Z0000 Encounter for general adult medical examination without abnormal findings: Secondary | ICD-10-CM

## 2019-10-01 LAB — CBC WITH DIFFERENTIAL/PLATELET
Absolute Monocytes: 372 cells/uL (ref 200–950)
Basophils Absolute: 41 cells/uL (ref 0–200)
Basophils Relative: 0.8 %
Eosinophils Absolute: 143 cells/uL (ref 15–500)
Eosinophils Relative: 2.8 %
HCT: 40.5 % (ref 35.0–45.0)
Hemoglobin: 13.7 g/dL (ref 11.7–15.5)
Lymphs Abs: 1540 cells/uL (ref 850–3900)
MCH: 32.4 pg (ref 27.0–33.0)
MCHC: 33.8 g/dL (ref 32.0–36.0)
MCV: 95.7 fL (ref 80.0–100.0)
MPV: 10.2 fL (ref 7.5–12.5)
Monocytes Relative: 7.3 %
Neutro Abs: 3004 cells/uL (ref 1500–7800)
Neutrophils Relative %: 58.9 %
Platelets: 224 10*3/uL (ref 140–400)
RBC: 4.23 10*6/uL (ref 3.80–5.10)
RDW: 12.6 % (ref 11.0–15.0)
Total Lymphocyte: 30.2 %
WBC: 5.1 10*3/uL (ref 3.8–10.8)

## 2019-10-01 LAB — LIPID PANEL
Cholesterol: 200 mg/dL — ABNORMAL HIGH (ref <200)
HDL: 64 mg/dL (ref >=50)
LDL Cholesterol (Calc): 115 mg/dL (calc) — ABNORMAL HIGH
Non-HDL Cholesterol (Calc): 136 mg/dL (calc) — ABNORMAL HIGH (ref <130)
Total CHOL/HDL Ratio: 3.1 (calc) (ref <5.0)
Triglycerides: 104 mg/dL (ref <150)

## 2019-10-01 LAB — COMPLETE METABOLIC PANEL WITH GFR
AG Ratio: 1.9 (calc) (ref 1.0–2.5)
ALT: 28 U/L (ref 6–29)
AST: 18 U/L (ref 10–35)
Albumin: 4.1 g/dL (ref 3.6–5.1)
Alkaline phosphatase (APISO): 102 U/L (ref 37–153)
BUN: 16 mg/dL (ref 7–25)
CO2: 28 mmol/L (ref 20–32)
Calcium: 9.2 mg/dL (ref 8.6–10.4)
Chloride: 106 mmol/L (ref 98–110)
Creat: 0.78 mg/dL (ref 0.50–1.05)
GFR, Est African American: 97 mL/min/{1.73_m2} (ref >=60)
GFR, Est Non African American: 84 mL/min/{1.73_m2} (ref >=60)
Globulin: 2.2 g/dL (calc) (ref 1.9–3.7)
Glucose, Bld: 83 mg/dL (ref 65–99)
Potassium: 4.1 mmol/L (ref 3.5–5.3)
Sodium: 141 mmol/L (ref 135–146)
Total Bilirubin: 0.7 mg/dL (ref 0.2–1.2)
Total Protein: 6.3 g/dL (ref 6.1–8.1)

## 2019-10-01 LAB — VITAMIN D 25 HYDROXY (VIT D DEFICIENCY, FRACTURES): Vit D, 25-Hydroxy: 24 ng/mL — ABNORMAL LOW (ref 30–100)

## 2019-10-01 LAB — TSH: TSH: 0.72 mIU/L (ref 0.40–4.50)

## 2019-10-07 ENCOUNTER — Ambulatory Visit (INDEPENDENT_AMBULATORY_CARE_PROVIDER_SITE_OTHER): Payer: 59 | Admitting: Internal Medicine

## 2019-10-07 ENCOUNTER — Encounter: Payer: Self-pay | Admitting: Internal Medicine

## 2019-10-07 ENCOUNTER — Other Ambulatory Visit: Payer: Self-pay

## 2019-10-07 VITALS — BP 130/88 | HR 88 | Temp 98.2°F | Ht 65.0 in | Wt 134.0 lb

## 2019-10-07 DIAGNOSIS — E559 Vitamin D deficiency, unspecified: Secondary | ICD-10-CM

## 2019-10-07 DIAGNOSIS — Z9889 Other specified postprocedural states: Secondary | ICD-10-CM | POA: Diagnosis not present

## 2019-10-07 DIAGNOSIS — B009 Herpesviral infection, unspecified: Secondary | ICD-10-CM

## 2019-10-07 DIAGNOSIS — E7800 Pure hypercholesterolemia, unspecified: Secondary | ICD-10-CM

## 2019-10-07 DIAGNOSIS — Z0000 Encounter for general adult medical examination without abnormal findings: Secondary | ICD-10-CM

## 2019-10-07 DIAGNOSIS — I10 Essential (primary) hypertension: Secondary | ICD-10-CM

## 2019-10-07 LAB — POCT URINALYSIS DIPSTICK
Appearance: NEGATIVE
Bilirubin, UA: NEGATIVE
Blood, UA: NEGATIVE
Glucose, UA: NEGATIVE
Ketones, UA: NEGATIVE
Leukocytes, UA: NEGATIVE
Nitrite, UA: NEGATIVE
Odor: NEGATIVE
Protein, UA: NEGATIVE
Spec Grav, UA: 1.015 (ref 1.010–1.025)
Urobilinogen, UA: 0.2 E.U./dL
pH, UA: 6.5 (ref 5.0–8.0)

## 2019-10-07 MED ORDER — ERGOCALCIFEROL 1.25 MG (50000 UT) PO CAPS: 50000.0000 [IU] | ORAL_CAPSULE | ORAL | 1 refills | Status: DC

## 2019-10-07 MED ORDER — VALACYCLOVIR HCL 500 MG PO TABS: ORAL_TABLET | ORAL | 3 refills | Status: DC

## 2019-10-07 NOTE — Progress Notes (Addendum)
   Subjective:    Patient ID: Connie Monroe, female    DOB: 1960-11-14, 59 y.o.   MRN: [id number]  HPI 59 year old Female for health maintenance exam.  She has a history of hypertension.  Has occasional outbreaks of herpes simplex type I and Valtrex was refilled for her today.  She was on a ski trip to [place] and tore her right ACL and is status post surgery.  She is recovering but it has been somewhat difficult.  Dr. Nori Riis is GYN physician.  She had a uterine ablation July 2007.  She had colonoscopy with Dr. Collene Mares in November 2014 which was normal.  7-year follow-up recommended.  Patient had Covid-19 vaccine 09/20/2019.  She will have tetanus update in the near future after receiving second dose of Mederma.  Has had Shingrix vaccine.  Social history: She is married.  Does not work outside the home.  She is a former Teaching laboratory technician.  She is smoked for well over 25 years with just a few cigarettes daily.  She has 3 children, a daughter and 2 sons.  Social alcohol consumption.  Family history: Father with history of diabetes.  1 sister with history of epilepsy and Down syndrome.  Maternal grandmother with history of coronary artery disease and hypertension.  Her vitamin D level is low at 24 and she will be placed on high-dose vitamin D for 6 months  Review of Systems  Respiratory: Negative.   Cardiovascular: Negative.   Gastrointestinal: Negative.   Genitourinary: Negative.   Neurological: Negative.        Objective:   Physical Exam Blood pressure 130/88 pulse 88 temperature 98.2 degrees orally, pulse oximetry 97% weight 134 pounds height 5 feet 5 inches BMI 22.30  Skin warm and dry.  Nodes none.  TMs are clear.  Neck supple.  No thyromegaly.  Chest clear to auscultation.  Breast without masses.  Cardiac exam regular rate and rhythm normal S1 and S2.  Abdomen soft nondistended without hepatosplenomegaly masses or tenderness.  She has lost muscle mass in her right lower  extremity after surgery.  Neuro intact without focal deficits.  Affect thought and judgment are normal.  GYN exam deferred to Dr. Dory Horn       Assessment & Plan:  Vitamin D deficiency-prescribe Drisdol 50,000 units by mouth weekly  for 6 months  Essential hypertension-stable on current regimen  History of Herpes simplex type I-Valtrex represcribed  History of smoking-needs to quit  Elevated LDL cholesterol likely due to inactivity with recent ACL tear and surgical repair  Recent repair of ACL which occurred on ski trip to [place] status post repair by Dr. Hillery Aldo  Plan: After she finishes 6 months of vitamin D3 she should take 4000 units D3 by mouth daily  Return in 1 year or as needed.  Time for repeat colonoscopy with Dr. Collene Mares

## 2019-10-07 NOTE — Patient Instructions (Signed)
Take Valtrex as needed for 5-day outbreaks Herpes simplex type 1.  Take vitamin D3 50,000 units weekly for 6 months then take 4000 units orally over-the-counter daily.  Continue with same medication for essential hypertension.  Please quit smoking.  Watch diet as LDL is slightly elevated likely due to inactivity.  Please check with Dr. Collene Mares regarding repeat colonoscopy.  Follow-up in 1 year here.

## 2020-01-02 ENCOUNTER — Telehealth: Payer: Self-pay | Admitting: Internal Medicine

## 2020-01-02 MED ORDER — LOSARTAN POTASSIUM 25 MG PO TABS: 25.0000 mg | ORAL_TABLET | Freq: Every day | ORAL | 0 refills | Status: DC

## 2020-01-02 NOTE — Telephone Encounter (Signed)
Received Fax RX request from  [facility] [place], [place] Phone:  [phone]  Fax:  [phone]       Medication - losartan (COZAAR) 25 MG tablet   Last Refill - 11/18/19  Last OV - 10/07/19  Last CPE - 10/07/19  Next Appointment -

## 2020-01-02 NOTE — Telephone Encounter (Signed)
Refill x 90 days 

## 2020-01-16 ENCOUNTER — Encounter: Payer: Self-pay | Admitting: Internal Medicine

## 2020-01-16 ENCOUNTER — Ambulatory Visit: Payer: 59 | Admitting: Internal Medicine

## 2020-01-16 ENCOUNTER — Other Ambulatory Visit: Payer: Self-pay

## 2020-01-16 ENCOUNTER — Telehealth: Payer: Self-pay | Admitting: Internal Medicine

## 2020-01-16 VITALS — BP 102/80 | HR 88 | Temp 99.5°F | Ht 65.0 in | Wt 133.0 lb

## 2020-01-16 DIAGNOSIS — R0981 Nasal congestion: Secondary | ICD-10-CM

## 2020-01-16 DIAGNOSIS — J0100 Acute maxillary sinusitis, unspecified: Secondary | ICD-10-CM

## 2020-01-16 NOTE — Telephone Encounter (Signed)
LVM to CB and schedule OV for today

## 2020-01-16 NOTE — Telephone Encounter (Signed)
[name] [phone]  Connie Monroe called to say that the right side of her nose is draining it started yesterday with some congestion she is also sneezing, has a little on left side. Had 2nd COVID vaccine in March

## 2020-01-16 NOTE — Telephone Encounter (Signed)
Schedule OV

## 2020-01-17 ENCOUNTER — Telehealth: Payer: Self-pay | Admitting: Internal Medicine

## 2020-01-17 ENCOUNTER — Encounter: Payer: Self-pay | Admitting: Internal Medicine

## 2020-01-17 MED ORDER — AZITHROMYCIN 250 MG PO TABS: ORAL_TABLET | ORAL | 0 refills | Status: DC

## 2020-01-17 MED ORDER — HYDROCODONE-HOMATROPINE 5-1.5 MG/5ML PO SYRP: 5.0000 mL | ORAL_SOLUTION | Freq: Three times a day (TID) | ORAL | 0 refills | Status: DC | PRN

## 2020-01-17 NOTE — Progress Notes (Signed)
   Subjective:    Patient ID: Connie Monroe, female    DOB: 11/22/1960, 59 y.o.   MRN: [id number]  HPI 59 year old Female seen for respiratory congestion onset after trip to [place] this past weekend to visit her son who is in the [employer].  Patient flew to [place].  She stated a hotel.  No known COVID-19 exposure.  She has been vaccinated for Covid-19  She has a history of hypertension treated with losartan 25 mg daily.  Review of Systems some malaise and fatigue, cough and nasal congestion     Objective:   Physical Exam She is afebrile.  Blood pressure 102/80 pulse 88 temperature 99.5 degrees orally weight 133 pounds  Looks a bit fatigued.  Skin warm and dry.  No cervical adenopathy.  TMs slightly full.  Pharynx without exudate.  Neck is supple.  Chest clear to auscultation without rales or wheezing.       Assessment & Plan:  Acute upper respiratory infection  Acute maxillary sinusitis  Plan: Zithromax Z-PAK 2 p.o. day 1 followed by 1 p.o. days 2 through 5.  Hycodan 1 teaspoon p.o. every 8 hours as needed cough.  Rest and drink plenty of fluids.

## 2020-01-17 NOTE — Patient Instructions (Addendum)
Hycodan one tsp po q 8 hours prn cough and sore throat. Zithromax Z pak 2 po day 1 followed by one po days 2-5.  Rest and drink plenty of fluids.

## 2020-01-17 NOTE — Telephone Encounter (Signed)
I forgot to send in her Rxs yesterday. Called patient to apologize and have sent in Hycodan and Z-pak.

## 2020-01-20 LAB — RESPIRATORY VIRUS PANEL

## 2020-03-30 LAB — HM MAMMOGRAPHY

## 2020-04-09 ENCOUNTER — Ambulatory Visit: Payer: Self-pay | Admitting: Physician Assistant

## 2020-04-14 ENCOUNTER — Ambulatory Visit: Payer: 59 | Admitting: Physician Assistant

## 2020-04-14 ENCOUNTER — Encounter: Payer: Self-pay | Admitting: Physician Assistant

## 2020-04-14 ENCOUNTER — Other Ambulatory Visit: Payer: Self-pay

## 2020-04-14 DIAGNOSIS — L570 Actinic keratosis: Secondary | ICD-10-CM | POA: Diagnosis not present

## 2020-04-14 DIAGNOSIS — Z1283 Encounter for screening for malignant neoplasm of skin: Secondary | ICD-10-CM | POA: Diagnosis not present

## 2020-04-14 DIAGNOSIS — Z86018 Personal history of other benign neoplasm: Secondary | ICD-10-CM | POA: Diagnosis not present

## 2020-04-14 DIAGNOSIS — L578 Other skin changes due to chronic exposure to nonionizing radiation: Secondary | ICD-10-CM

## 2020-04-14 DIAGNOSIS — Z85828 Personal history of other malignant neoplasm of skin: Secondary | ICD-10-CM

## 2020-04-14 MED ORDER — TOLAK 4 % EX CREA: 1.0000 "application " | TOPICAL_CREAM | Freq: Every day | CUTANEOUS | 1 refills | Status: DC

## 2020-04-15 ENCOUNTER — Telehealth: Payer: Self-pay | Admitting: *Deleted

## 2020-04-15 MED ORDER — TOLAK 4 % EX CREA: 1.0000 "application " | TOPICAL_CREAM | Freq: Every day | CUTANEOUS | 1 refills | Status: DC

## 2020-04-15 MED ORDER — TOLAK 4 % EX CREA: 1.0000 | TOPICAL_CREAM | Freq: Every day | CUTANEOUS | 1 refills | Status: DC

## 2020-04-15 NOTE — Telephone Encounter (Signed)
[place] unable to get [place] cream so sent prescription to [facility]

## 2020-04-15 NOTE — Progress Notes (Signed)
   Follow-Up Visit   Subjective  Connie Monroe is a 59 y.o. female who presents for the following: Annual Exam (CHECK CHEST, FOREHEAD, SCALE ON LEFT ARM X MONTHS).   The following portions of the chart were reviewed this encounter and updated as appropriate: Tobacco  Allergies  Meds  Problems  Med Hx  Surg Hx  Fam Hx      Objective  Well appearing patient in no apparent distress; mood and affect are within normal limits.  A full examination was performed including scalp, head, eyes, ears, nose, lips, neck, chest, axillae, abdomen, back, buttocks, bilateral upper extremities, bilateral lower extremities, hands, feet, fingers, toes, fingernails, and toenails. All findings within normal limits unless otherwise noted below.  Objective  Head - Anterior (Face), Left Forearm - Anterior, Left Upper Arm - Anterior, Mid Tip of Nose (2), Right Breast, chest (2): Erythematous patches with gritty scale.  Objective  Head to toe: No atypical nevi No signs of non-mole skin cancer.   Objective  Right chest: Scar clear  Objective  Left Abdomen (side) - Upper: Scar clear  Objective  chest: Diffuse rough scales  Assessment & Plan  AK (actinic keratosis) (8) Head - Anterior (Face); Left Upper Arm - Anterior; Left Forearm - Anterior; chest (2); Right Breast; Mid Tip of Nose (2)  Destruction of lesion - Head - Anterior (Face), Left Forearm - Anterior, Left Upper Arm - Anterior, Mid Tip of Nose, Right Breast, chest Complexity: simple   Destruction method: cryotherapy   Informed consent: discussed and consent obtained   Timeout:  patient name, date of birth, surgical site, and procedure verified Lesion destroyed using liquid nitrogen: Yes   Cryotherapy cycles:  1 Outcome: patient tolerated procedure well with no complications   Post-procedure details: wound care instructions given    Screening exam for skin cancer Head to toe  Yearly skin exams  History of SCC (squamous cell  carcinoma) of skin Right chest  observe  History of dysplastic nevus Left Abdomen (side) - Upper  observe  Actinic skin damage chest  Reordered Medications Fluorouracil (TOLAK) 4 % CREA   I, Rishita Petron, PA-C, have reviewed all documentation's for this visit.  The documentation on 04/15/20 for the exam, diagnosis, procedures and orders are all accurate and complete.

## 2020-04-22 ENCOUNTER — Other Ambulatory Visit: Payer: Self-pay | Admitting: Internal Medicine

## 2020-04-23 NOTE — Telephone Encounter (Signed)
Needs CPE appt for March before refilling- please book

## 2020-10-12 ENCOUNTER — Other Ambulatory Visit: Payer: BC Managed Care – PPO | Admitting: Internal Medicine

## 2020-10-12 ENCOUNTER — Other Ambulatory Visit: Payer: Self-pay

## 2020-10-12 DIAGNOSIS — E559 Vitamin D deficiency, unspecified: Secondary | ICD-10-CM

## 2020-10-12 DIAGNOSIS — E7800 Pure hypercholesterolemia, unspecified: Secondary | ICD-10-CM

## 2020-10-12 DIAGNOSIS — Z0000 Encounter for general adult medical examination without abnormal findings: Secondary | ICD-10-CM

## 2020-10-12 DIAGNOSIS — I10 Essential (primary) hypertension: Secondary | ICD-10-CM

## 2020-10-13 ENCOUNTER — Encounter: Payer: Self-pay | Admitting: Internal Medicine

## 2020-10-13 ENCOUNTER — Ambulatory Visit (INDEPENDENT_AMBULATORY_CARE_PROVIDER_SITE_OTHER): Payer: BC Managed Care – PPO | Admitting: Internal Medicine

## 2020-10-13 VITALS — BP 140/90 | HR 80 | Ht 65.0 in | Wt 136.0 lb

## 2020-10-13 DIAGNOSIS — Z8639 Personal history of other endocrine, nutritional and metabolic disease: Secondary | ICD-10-CM | POA: Diagnosis not present

## 2020-10-13 DIAGNOSIS — B009 Herpesviral infection, unspecified: Secondary | ICD-10-CM

## 2020-10-13 DIAGNOSIS — E7800 Pure hypercholesterolemia, unspecified: Secondary | ICD-10-CM

## 2020-10-13 DIAGNOSIS — Z0000 Encounter for general adult medical examination without abnormal findings: Secondary | ICD-10-CM

## 2020-10-13 DIAGNOSIS — I10 Essential (primary) hypertension: Secondary | ICD-10-CM

## 2020-10-13 LAB — CBC WITH DIFFERENTIAL/PLATELET
Absolute Monocytes: 294 cells/uL (ref 200–950)
Basophils Absolute: 29 cells/uL (ref 0–200)
Basophils Relative: 0.6 %
Eosinophils Absolute: 132 cells/uL (ref 15–500)
Eosinophils Relative: 2.7 %
HCT: 42 % (ref 35.0–45.0)
Hemoglobin: 14.3 g/dL (ref 11.7–15.5)
Lymphs Abs: 1519 cells/uL (ref 850–3900)
MCH: 32.1 pg (ref 27.0–33.0)
MCHC: 34 g/dL (ref 32.0–36.0)
MCV: 94.2 fL (ref 80.0–100.0)
MPV: 10.2 fL (ref 7.5–12.5)
Monocytes Relative: 6 %
Neutro Abs: 2925 cells/uL (ref 1500–7800)
Neutrophils Relative %: 59.7 %
Platelets: 234 10*3/uL (ref 140–400)
RBC: 4.46 10*6/uL (ref 3.80–5.10)
RDW: 12.4 % (ref 11.0–15.0)
Total Lymphocyte: 31 %
WBC: 4.9 10*3/uL (ref 3.8–10.8)

## 2020-10-13 LAB — LIPID PANEL
Cholesterol: 210 mg/dL — ABNORMAL HIGH (ref <200)
HDL: 78 mg/dL (ref >=50)
LDL Cholesterol (Calc): 113 mg/dL (calc) — ABNORMAL HIGH
Non-HDL Cholesterol (Calc): 132 mg/dL (calc) — ABNORMAL HIGH (ref <130)
Total CHOL/HDL Ratio: 2.7 (calc) (ref <5.0)
Triglycerides: 89 mg/dL (ref <150)

## 2020-10-13 LAB — COMPLETE METABOLIC PANEL WITH GFR
AG Ratio: 2.1 (calc) (ref 1.0–2.5)
ALT: 13 U/L (ref 6–29)
AST: 15 U/L (ref 10–35)
Albumin: 4.2 g/dL (ref 3.6–5.1)
Alkaline phosphatase (APISO): 110 U/L (ref 37–153)
BUN: 18 mg/dL (ref 7–25)
CO2: 24 mmol/L (ref 20–32)
Calcium: 9.1 mg/dL (ref 8.6–10.4)
Chloride: 108 mmol/L (ref 98–110)
Creat: 0.91 mg/dL (ref 0.50–1.05)
GFR, Est African American: 80 mL/min/{1.73_m2} (ref >=60)
GFR, Est Non African American: 69 mL/min/{1.73_m2} (ref >=60)
Globulin: 2 g/dL (calc) (ref 1.9–3.7)
Glucose, Bld: 90 mg/dL (ref 65–99)
Potassium: 3.8 mmol/L (ref 3.5–5.3)
Sodium: 141 mmol/L (ref 135–146)
Total Bilirubin: 0.5 mg/dL (ref 0.2–1.2)
Total Protein: 6.2 g/dL (ref 6.1–8.1)

## 2020-10-13 LAB — VITAMIN D 25 HYDROXY (VIT D DEFICIENCY, FRACTURES): Vit D, 25-Hydroxy: 41 ng/mL (ref 30–100)

## 2020-10-13 LAB — POCT URINALYSIS DIPSTICK
Appearance: NEGATIVE
Bilirubin, UA: NEGATIVE
Blood, UA: NEGATIVE
Glucose, UA: NEGATIVE
Ketones, UA: NEGATIVE
Leukocytes, UA: NEGATIVE
Nitrite, UA: NEGATIVE
Odor: NEGATIVE
Protein, UA: NEGATIVE
Spec Grav, UA: 1.015 (ref 1.010–1.025)
Urobilinogen, UA: 0.2 E.U./dL
pH, UA: 6.5 (ref 5.0–8.0)

## 2020-10-13 LAB — TSH: TSH: 0.8 mIU/L (ref 0.40–4.50)

## 2020-10-13 MED ORDER — LOSARTAN POTASSIUM 25 MG PO TABS: ORAL_TABLET | ORAL | 3 refills | Status: DC

## 2020-10-13 MED ORDER — VALACYCLOVIR HCL 500 MG PO TABS: ORAL_TABLET | ORAL | 3 refills | Status: DC

## 2020-10-13 NOTE — Progress Notes (Deleted)
6 

## 2020-10-14 MED ORDER — ERGOCALCIFEROL 1.25 MG (50000 UT) PO CAPS: 50000.0000 [IU] | ORAL_CAPSULE | ORAL | 3 refills | Status: DC

## 2020-10-14 NOTE — Patient Instructions (Signed)
Weekly high-dose vitamin D prescription has been refilled.  Continue current medications as previously prescribed and return in 1 year or as needed.  Colonoscopy with Dr. Collene Mares is now due.  Tetanus immunization is due.

## 2020-10-14 NOTE — Progress Notes (Signed)
Subjective:    Patient ID: Connie Monroe, female    DOB: 06/08/1961, 60 y.o.   MRN: [id number]  HPI 60 year old Female seen for health maintenance exam and evaluation of medical issues.  She has a history of mild hypertension, pure hypercholesterolemia, vitamin D deficiency, Herpes simplex type I.  History of torn right ACL on a ski trip to [place] in December 2020 and underwent reconstruction by Dr. Theda Sers in 2021.  Dr. Nori Riis is GYN physician.  She had uterine ablation July 2007.  She had colonoscopy with Dr. Collene Mares in November 2014 which was normal.  7-year follow-up was recommended.  This is now due and she should contact Dr. Lorie Apley office.  Patient has had 3 COVID vaccines.  I would like for her to continue with high-dose vitamin D.  History of vitamin D deficiency but level is now normal.  Have refilled Drisdol 50,000 units weekly for 1 year.  Has had Shingrix series.  Had flu vaccine in October 2021.  Should get tetanus update in the near future.  Social history: She is married.  She has smoked for well over 25 years just a few cigarettes daily.  Has 3 children, a daughter and 2 sons.  Social alcohol consumption.  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  Takes care of her mother and her sister.  Family history: Father with history of diabetes.  1 sister with history of epilepsy and Down syndrome.  Maternal grandmother with history of coronary artery disease and hypertension.    Review of Systems  Constitutional: Negative.   Respiratory: Negative.   Cardiovascular: Negative.   Gastrointestinal: Negative.   Genitourinary: Negative.   Musculoskeletal: Negative.   Neurological: Negative.   Psychiatric/Behavioral: Negative.        Objective:   Physical Exam Vitals reviewed.  Constitutional:      General: She is not in acute distress.    Appearance: Normal appearance.  HENT:     Head: Normocephalic and atraumatic.      Right Ear: Tympanic membrane normal.     Left Ear: Tympanic membrane normal.     Nose: Nose normal.  Eyes:     General: No scleral icterus.       Right eye: No discharge.        Left eye: No discharge.     Extraocular Movements: Extraocular movements intact.     Conjunctiva/sclera: Conjunctivae normal.     Pupils: Pupils are equal, round, and reactive to light.  Neck:     Vascular: No carotid bruit.     Comments: No thyromegaly Cardiovascular:     Rate and Rhythm: Normal rate and regular rhythm.     Heart sounds: Normal heart sounds. No murmur heard.     Comments: No murmurs appreciated Pulmonary:     Effort: Pulmonary effort is normal.     Breath sounds: Normal breath sounds. No wheezing or rales.     Comments: Breasts are without masses Abdominal:     General: Bowel sounds are normal. There is no distension.     Palpations: Abdomen is soft. There is no mass.     Tenderness: There is no abdominal tenderness. There is no guarding or rebound.  Genitourinary:    Comments: Deferred to GYN physician Musculoskeletal:     Cervical back: Neck supple. No rigidity.     Right lower leg: No edema.     Left lower leg: No edema.  Lymphadenopathy:  Cervical: No cervical adenopathy.  Skin:    General: Skin is warm and dry.     Findings: No rash.  Neurological:     General: No focal deficit present.     Mental Status: She is alert and oriented to person, place, and time.     Sensory: No sensory deficit.     Motor: No weakness.     Coordination: Coordination normal.  Psychiatric:        Mood and Affect: Mood normal.        Behavior: Behavior normal.        Thought Content: Thought content normal.        Judgment: Judgment normal.           Assessment & Plan:  Essential hypertension stable on current regimen  History of Herpes simplex type I-Valtrex refilled  History of smoking-reminded to quit  History of elevated LDL cholesterol and total cholesterol-total cholesterol  is 213 and was 200 a year ago.  LDL was 115 and is now 113.  She has a good HDL of 78 normal triglycerides of 89.  Continue with diet and exercise efforts.  She does not want to be on statin medication.  History of vitamin D deficiency-last year level was 24 and it is now 10.  I have refilled Drisdol weekly 50,000 units for 1 year.  History of repair of ACL by Dr. Theda Sers which occurred on ski trip to [place] in December 2020.  Plan: Continue current medications and follow-up in 1 year or as needed.  Colonoscopy is now due with Dr. Collene Mares.  Also a tetanus immunization update is due and we will contact her regarding this.

## 2020-10-17 ENCOUNTER — Other Ambulatory Visit: Payer: Self-pay | Admitting: Internal Medicine

## 2020-10-20 ENCOUNTER — Encounter: Payer: Self-pay | Admitting: Internal Medicine

## 2020-12-15 ENCOUNTER — Other Ambulatory Visit: Payer: Self-pay | Admitting: Internal Medicine

## 2021-02-23 ENCOUNTER — Ambulatory Visit (INDEPENDENT_AMBULATORY_CARE_PROVIDER_SITE_OTHER): Payer: BC Managed Care – PPO | Admitting: Internal Medicine

## 2021-02-23 ENCOUNTER — Encounter: Payer: Self-pay | Admitting: Internal Medicine

## 2021-02-23 ENCOUNTER — Other Ambulatory Visit: Payer: Self-pay

## 2021-02-23 VITALS — BP 140/100 | HR 77 | Temp 98.4°F | Ht 66.0 in | Wt 139.0 lb

## 2021-02-23 DIAGNOSIS — H1031 Unspecified acute conjunctivitis, right eye: Secondary | ICD-10-CM

## 2021-02-23 MED ORDER — OFLOXACIN 0.3 % OP SOLN: 2.0000 [drp] | Freq: Four times a day (QID) | OPHTHALMIC | 0 refills | Status: DC

## 2021-02-23 MED ORDER — AZITHROMYCIN 250 MG PO TABS: ORAL_TABLET | ORAL | 0 refills | Status: AC

## 2021-02-23 NOTE — Progress Notes (Signed)
   Subjective:    Patient ID: Connie Monroe, female    DOB: 12/14/1960, 60 y.o.   MRN: [id number]  HPI 60 year old Female in excellent physical condition was doing some yard work recently.  She thinks perhaps a shrub she was trimming had a long stem that possibly struck her in the eye. Irritation in the eye has gotten  a bit worse.  She thought perhaps this was a stye.    Review of Systems-denies fever,chills or significant drainage from eye     Objective:   Physical Exam Vital signs reviewed.  She is afebrile. Extraocular movements of the right eye are normal.  PERRLA.  Minimal erythema of conjunctiva      Assessment & Plan:  Probable conjunctivitis right eye.  I have offered prescription for Ocuflox ophthalmic drops to the right eye 4 times a day.  Patient prefers a Zithromax Z-PAK instead and this was provided.  She will apply warm hot compresses to the right area 2 or 3 times daily.  She will let me know if not improving.

## 2021-03-03 ENCOUNTER — Other Ambulatory Visit: Payer: Self-pay | Admitting: Internal Medicine

## 2021-03-23 ENCOUNTER — Ambulatory Visit: Payer: BC Managed Care – PPO | Admitting: Physician Assistant

## 2021-03-23 ENCOUNTER — Other Ambulatory Visit: Payer: Self-pay

## 2021-03-23 DIAGNOSIS — L570 Actinic keratosis: Secondary | ICD-10-CM | POA: Diagnosis not present

## 2021-03-23 DIAGNOSIS — L820 Inflamed seborrheic keratosis: Secondary | ICD-10-CM

## 2021-03-23 DIAGNOSIS — Z86018 Personal history of other benign neoplasm: Secondary | ICD-10-CM | POA: Diagnosis not present

## 2021-03-23 DIAGNOSIS — Z1283 Encounter for screening for malignant neoplasm of skin: Secondary | ICD-10-CM

## 2021-03-23 DIAGNOSIS — Z85828 Personal history of other malignant neoplasm of skin: Secondary | ICD-10-CM

## 2021-03-23 MED ORDER — TOLAK 4 % EX CREA: 1.0000 | TOPICAL_CREAM | Freq: Every evening | CUTANEOUS | 0 refills | Status: DC

## 2021-03-23 NOTE — Patient Instructions (Addendum)
[facility] will contact you regarding your Tolak prescription. If you do not hear from them by the end of the business day today then give them a call @ [phone]

## 2021-03-24 ENCOUNTER — Encounter: Payer: Self-pay | Admitting: Physician Assistant

## 2021-03-24 NOTE — Progress Notes (Signed)
   Follow-Up Visit   Subjective  Connie Monroe is a 60 y.o. female who presents for the following: Annual Exam (Patient here today for yearly skin check. Per patient she has a raised lesion on her left back that's rough, no bleeding, no itching. Personal history or atypical mole, and non mole skin cancer. No personal history of melanoma. No family history of atypical moles, melanoma or non mole skin cancer. ).   The following portions of the chart were reviewed this encounter and updated as appropriate:  Tobacco  Allergies  Meds  Problems  Med Hx  Surg Hx  Fam Hx      Objective  Well appearing patient in no apparent distress; mood and affect are within normal limits.  A full examination was performed including scalp, head, eyes, ears, nose, lips, neck, chest, axillae, abdomen, back, buttocks, bilateral upper extremities, bilateral lower extremities, hands, feet, fingers, toes, fingernails, and toenails. All findings within normal limits unless otherwise noted below.  Left Inframammary Fold (6), Left Lower Back (6), Left Lower Leg - Anterior (2), Left Thigh - Anterior (3), Right Inframammary Fold (5), Right Lower Back, Right Lower Leg - Anterior Erythematous stuck-on, plaques.      Chest, Left Upper Back, Right Thigh - Anterior (5) Erythematous patches with gritty scale.   Assessment & Plan  Inflamed seborrheic keratosis Left Thigh - Anterior (3); Left Lower Leg - Anterior (2); Right Lower Leg - Anterior; Left Inframammary Fold (6); Right Inframammary Fold (5); Left Lower Back (6); Right Lower Back  Destruction of lesion - Left Inframammary Fold, Left Lower Back, Left Lower Leg - Anterior, Left Thigh - Anterior, Right Inframammary Fold, Right Lower Back, Right Lower Leg - Anterior Complexity: simple   Destruction method: cryotherapy   Informed consent: discussed and consent obtained   Timeout:  patient name, date of birth, surgical site, and procedure verified Lesion  destroyed using liquid nitrogen: Yes   Cryotherapy cycles:  4 Outcome: patient tolerated procedure well with no complications   Post-procedure details: wound care instructions given    AK (actinic keratosis) (7) Right Thigh - Anterior (5); Chest; Left Upper Back  Will use Tolak in the Fall on her chest.   Destruction of lesion - Chest, Left Upper Back, Right Thigh - Anterior Complexity: simple   Destruction method: cryotherapy   Informed consent: discussed and consent obtained   Timeout:  patient name, date of birth, surgical site, and procedure verified Lesion destroyed using liquid nitrogen: Yes   Cryotherapy cycles:  4 Outcome: patient tolerated procedure well with no complications   Post-procedure details: wound care instructions given    Fluorouracil (TOLAK) 4 % CREA - Chest Apply 1 application topically at bedtime.    I, Taleisha Kaczynski, PA-C, have reviewed all documentation's for this visit.  The documentation on 03/24/21 for the exam, diagnosis, procedures and orders are all accurate and complete.

## 2021-03-28 NOTE — Patient Instructions (Signed)
Apply warm compresses to right eye for 20 minutes 2-3 times daily.  Zithromax Z-PAK has been sent into your pharmacy as requested.  Call if symptoms worsen.

## 2021-04-05 DIAGNOSIS — Z1231 Encounter for screening mammogram for malignant neoplasm of breast: Secondary | ICD-10-CM | POA: Diagnosis not present

## 2021-05-17 DIAGNOSIS — Z01419 Encounter for gynecological examination (general) (routine) without abnormal findings: Secondary | ICD-10-CM | POA: Diagnosis not present

## 2021-05-17 DIAGNOSIS — Z6824 Body mass index (BMI) 24.0-24.9, adult: Secondary | ICD-10-CM | POA: Diagnosis not present

## 2021-06-30 ENCOUNTER — Other Ambulatory Visit: Payer: Self-pay | Admitting: *Deleted

## 2021-11-25 ENCOUNTER — Other Ambulatory Visit: Payer: BC Managed Care – PPO

## 2021-11-25 DIAGNOSIS — E7800 Pure hypercholesterolemia, unspecified: Secondary | ICD-10-CM | POA: Diagnosis not present

## 2021-11-25 DIAGNOSIS — R5383 Other fatigue: Secondary | ICD-10-CM

## 2021-11-25 DIAGNOSIS — Z8639 Personal history of other endocrine, nutritional and metabolic disease: Secondary | ICD-10-CM

## 2021-11-25 DIAGNOSIS — E559 Vitamin D deficiency, unspecified: Secondary | ICD-10-CM

## 2021-11-25 DIAGNOSIS — I10 Essential (primary) hypertension: Secondary | ICD-10-CM

## 2021-11-26 LAB — CBC WITH DIFFERENTIAL/PLATELET
Absolute Monocytes: 370 cells/uL (ref 200–950)
Basophils Absolute: 30 cells/uL (ref 0–200)
Basophils Relative: 0.6 %
Eosinophils Absolute: 140 cells/uL (ref 15–500)
Eosinophils Relative: 2.8 %
HCT: 42.2 % (ref 35.0–45.0)
Hemoglobin: 14.8 g/dL (ref 11.7–15.5)
Lymphs Abs: 1450 cells/uL (ref 850–3900)
MCH: 32.8 pg (ref 27.0–33.0)
MCHC: 35.1 g/dL (ref 32.0–36.0)
MCV: 93.6 fL (ref 80.0–100.0)
MPV: 10 fL (ref 7.5–12.5)
Monocytes Relative: 7.4 %
Neutro Abs: 3010 cells/uL (ref 1500–7800)
Neutrophils Relative %: 60.2 %
Platelets: 248 10*3/uL (ref 140–400)
RBC: 4.51 10*6/uL (ref 3.80–5.10)
RDW: 12.4 % (ref 11.0–15.0)
Total Lymphocyte: 29 %
WBC: 5 10*3/uL (ref 3.8–10.8)

## 2021-11-26 LAB — TSH: TSH: 0.62 mIU/L (ref 0.40–4.50)

## 2021-11-26 LAB — COMPLETE METABOLIC PANEL WITH GFR
AG Ratio: 2.3 (calc) (ref 1.0–2.5)
ALT: 11 U/L (ref 6–29)
AST: 16 U/L (ref 10–35)
Albumin: 4.3 g/dL (ref 3.6–5.1)
Alkaline phosphatase (APISO): 96 U/L (ref 37–153)
BUN: 12 mg/dL (ref 7–25)
CO2: 26 mmol/L (ref 20–32)
Calcium: 9.4 mg/dL (ref 8.6–10.4)
Chloride: 106 mmol/L (ref 98–110)
Creat: 0.94 mg/dL (ref 0.50–1.05)
Globulin: 1.9 g/dL (calc) (ref 1.9–3.7)
Glucose, Bld: 86 mg/dL (ref 65–99)
Potassium: 4.3 mmol/L (ref 3.5–5.3)
Sodium: 140 mmol/L (ref 135–146)
Total Bilirubin: 0.8 mg/dL (ref 0.2–1.2)
Total Protein: 6.2 g/dL (ref 6.1–8.1)
eGFR: 69 mL/min/{1.73_m2} (ref >=60)

## 2021-11-26 LAB — LIPID PANEL
Cholesterol: 202 mg/dL — ABNORMAL HIGH (ref <200)
HDL: 78 mg/dL (ref >=50)
LDL Cholesterol (Calc): 109 mg/dL (calc) — ABNORMAL HIGH
Non-HDL Cholesterol (Calc): 124 mg/dL (calc) (ref <130)
Total CHOL/HDL Ratio: 2.6 (calc) (ref <5.0)
Triglycerides: 63 mg/dL (ref <150)

## 2021-11-26 LAB — VITAMIN D 25 HYDROXY (VIT D DEFICIENCY, FRACTURES): Vit D, 25-Hydroxy: 53 ng/mL (ref 30–100)

## 2021-11-29 ENCOUNTER — Encounter: Payer: Self-pay | Admitting: Internal Medicine

## 2021-11-29 ENCOUNTER — Ambulatory Visit (INDEPENDENT_AMBULATORY_CARE_PROVIDER_SITE_OTHER): Payer: BC Managed Care – PPO | Admitting: Internal Medicine

## 2021-11-29 VITALS — BP 120/86 | HR 68 | Temp 97.9°F | Ht 66.0 in | Wt 139.5 lb

## 2021-11-29 DIAGNOSIS — I10 Essential (primary) hypertension: Secondary | ICD-10-CM

## 2021-11-29 DIAGNOSIS — Z23 Encounter for immunization: Secondary | ICD-10-CM

## 2021-11-29 DIAGNOSIS — Z8639 Personal history of other endocrine, nutritional and metabolic disease: Secondary | ICD-10-CM

## 2021-11-29 DIAGNOSIS — Z0000 Encounter for general adult medical examination without abnormal findings: Secondary | ICD-10-CM | POA: Diagnosis not present

## 2021-11-29 DIAGNOSIS — E7800 Pure hypercholesterolemia, unspecified: Secondary | ICD-10-CM

## 2021-11-29 MED ORDER — VALACYCLOVIR HCL 500 MG PO TABS: ORAL_TABLET | ORAL | 3 refills | Status: DC

## 2021-11-29 MED ORDER — LOSARTAN POTASSIUM 25 MG PO TABS: 25.0000 mg | ORAL_TABLET | Freq: Every day | ORAL | 1 refills | Status: DC

## 2021-11-29 NOTE — Progress Notes (Unsigned)
Subjective:    Patient ID: Connie Monroe, female    DOB: October 14, 1960, 61 y.o.   MRN: [id number]  HPI Very pleasant 61 year old Female seen for health maintenance exam and evaluation of medical issues.  History of mild hypertension, pure hypercholesterolemia, vitamin D deficiency, Herpes simplex type I.  History of torn right ACL on a ski trip to [place] in December 2020.  She underwent reconstruction surgery by Dr. Theda Sers in 2021.  She had colonoscopy with Dr. Collene Mares in November 2014 which was normal.  Follow-up was recommended in 7 years.  This is now due.  Had Pap smear by Dr. Nori Riis in 2021 which was normal.  Social history: She is married.  Has smoked just a few cigarettes daily for over 25 years.  She has 3 children, daughter and 2 sons.  Social alcohol consumption.  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  Takes care of her mother and her sister.  Family history: Father with history of diabetes.  1 sister with history of epilepsy and Down syndrome.  Maternal grandmother with history of coronary artery disease and hypertension.    Review of Systems no new complaints.  Feels well.  Vaccines reviewed.  Pneumococcal 20 given today.  Tetanus immunization update given today.  Has had Shingrix series.     Objective:   Physical Exam Vitals reviewed.  Constitutional:      General: She is not in acute distress.    Appearance: Normal appearance.  HENT:     Head: Normocephalic and atraumatic.     Right Ear: Tympanic membrane normal.     Left Ear: Tympanic membrane normal.     Nose: Nose normal.     Mouth/Throat:     Pharynx: Oropharynx is clear.  Eyes:     General: No scleral icterus.    Extraocular Movements: Extraocular movements intact.     Conjunctiva/sclera: Conjunctivae normal.     Pupils: Pupils are equal, round, and reactive to light.  Neck:     Vascular: No carotid bruit.  Cardiovascular:     Rate and Rhythm:  Normal rate and regular rhythm.     Heart sounds: Normal heart sounds.  Pulmonary:     Breath sounds: Normal breath sounds.  Abdominal:     General: Bowel sounds are normal.     Palpations: Abdomen is soft. There is no mass.     Tenderness: There is no guarding or rebound.  Musculoskeletal:     Cervical back: Neck supple. No rigidity.     Right lower leg: No edema.     Left lower leg: No edema.  Lymphadenopathy:     Cervical: No cervical adenopathy.  Skin:    General: Skin is warm and dry.     Findings: No rash.  Neurological:     General: No focal deficit present.     Mental Status: She is alert and oriented to person, place, and time.  Psychiatric:        Mood and Affect: Mood normal.        Behavior: Behavior normal.        Thought Content: Thought content normal.        Judgment: Judgment normal.    Blood pressure 120/86 pulse 68 temperature 97.9 pulse oximetry 98% weight 139 pounds 8 ounces height 5 feet 6 inches BMI 22.52       Assessment & Plan:   Normal health maintenance exam  History  of vitamin D deficiency-currently vitamin D level is normal  Colonoscopy now due  Due for annual mammogram  Hypertension treated with low-dose losartan 25 mg daily, stable and under good control  History of HSV type I treated with Valtrex 500 mg daily  She has very mild elevation of LDL at 109.  HDL is excellent at 78.  Total cholesterol 202 and triglycerides are normal.  Remainder of labs are entirely normal.  Immunizations up-to-date.  Received Tdap today and pneumococcal 20.  Has had Shingrix series.  Plan: Return in 1 year or as needed.

## 2022-01-12 NOTE — Patient Instructions (Signed)
Prevnar 20 and tetanus immunization updates given today.  Colonoscopy is now due.  Due for annual mammogram.  Continue losartan 25 mg daily and Valtrex 500 mg daily for suppression therapy for herpes simplex type I.  It was a pleasure to see you today.  Return in 1 year or as needed.

## 2022-03-23 ENCOUNTER — Telehealth (INDEPENDENT_AMBULATORY_CARE_PROVIDER_SITE_OTHER): Payer: BC Managed Care – PPO | Admitting: Internal Medicine

## 2022-03-23 ENCOUNTER — Telehealth: Payer: Self-pay | Admitting: Internal Medicine

## 2022-03-23 VITALS — BP 130/90 | Wt 130.0 lb

## 2022-03-23 DIAGNOSIS — U071 COVID-19: Secondary | ICD-10-CM | POA: Diagnosis not present

## 2022-03-23 DIAGNOSIS — J22 Unspecified acute lower respiratory infection: Secondary | ICD-10-CM

## 2022-03-23 DIAGNOSIS — I10 Essential (primary) hypertension: Secondary | ICD-10-CM

## 2022-03-23 MED ORDER — AZITHROMYCIN 250 MG PO TABS: ORAL_TABLET | ORAL | 0 refills | Status: AC

## 2022-03-23 MED ORDER — HYDROCODONE BIT-HOMATROP MBR 5-1.5 MG/5ML PO SOLN: 5.0000 mL | Freq: Three times a day (TID) | ORAL | 0 refills | Status: DC | PRN

## 2022-03-23 NOTE — Progress Notes (Signed)
   Subjective:    Patient ID: Connie Monroe, female    DOB: 12-22-1960, 61 y.o.   MRN: [id number]  HPI 61 year old Female recently returned from a trip to [place] where she attended a wedding in [place] and also attended a play.  She and her husband traveled by airplane.  They visited [place] in addition to [place].  Her general health is excellent.  She has mild hypertension and pure hypercholesterolemia.  Records indicate 3 Moderna vaccines in 2021.  Just returned home last evening and she tested positive for COVID.  She is complaining of sore throat, myalgias, fever, chills and headache.  Has malaise and fatigue.  She is seen virtually today.  She is identified using 2 identifiers as Connie Monroe, a patient in this practice.  She is at her home and I am at my office.  She is agreeable to visit in this format today.  She is reports right ear discomfort and some nausea  Review of Systems see above     Objective:   Physical Exam  She reports her blood pressure is 130/90 and that her weight is 130 pounds.  She is seen virtually and looks fatigued and slightly pale.  She does not appear to be tachypneic.  Currently not coughing.  She does not seem to be hoarse.      Assessment & Plan:  COVID-19 virus infection  Plan: Patient advised to quarantine for 5 days and to stay well-hydrated.  Is to walk around her home some to prevent atelectasis.  Is to let me know if she develops significant shortness of breath.  We have prescribe Zithromax Z-PAK 2 tablets day 1 followed by 1 tab days 2 through 5.  Also Hycodan 1 teaspoon every 8 hours as needed for cough.  She will let me know if not improving in 24 to 48 hours.  We did discuss treatment with Paxlovid but we decided on Hycodan and Zithromax instead.  Time spent reviewing chart, interviewing patient and E scribing medications is 20 minutes

## 2022-03-23 NOTE — Telephone Encounter (Signed)
scheduled

## 2022-03-23 NOTE — Telephone Encounter (Addendum)
Connie Monroe [phone]  Connie Monroe just returned from [place] last night and she tested positive for COVID. She has sore throat, body aches, fever, chills really bad headache.

## 2022-03-28 ENCOUNTER — Encounter: Payer: Self-pay | Admitting: Internal Medicine

## 2022-03-28 NOTE — Patient Instructions (Signed)
I am sorry you are not feeling well today.  I have sent in Hycodan cough syrup 1 teaspoon every 8 hours as needed and a Zithromax Z-PAK.  Please stay well-hydrated.  Monitor your pulse oximetry and walk some to prevent atelectasis.  Call if not improving in 48 hours or sooner if worse.

## 2022-03-29 ENCOUNTER — Other Ambulatory Visit: Payer: Self-pay | Admitting: Internal Medicine

## 2022-03-31 ENCOUNTER — Ambulatory Visit: Payer: BC Managed Care – PPO | Admitting: Physician Assistant

## 2022-04-13 DIAGNOSIS — Z1231 Encounter for screening mammogram for malignant neoplasm of breast: Secondary | ICD-10-CM | POA: Diagnosis not present

## 2022-04-13 LAB — HM MAMMOGRAPHY

## 2022-04-14 ENCOUNTER — Encounter: Payer: Self-pay | Admitting: Internal Medicine

## 2022-05-02 DIAGNOSIS — L570 Actinic keratosis: Secondary | ICD-10-CM | POA: Diagnosis not present

## 2022-05-02 DIAGNOSIS — C4491 Basal cell carcinoma of skin, unspecified: Secondary | ICD-10-CM | POA: Diagnosis not present

## 2022-05-24 DIAGNOSIS — Z124 Encounter for screening for malignant neoplasm of cervix: Secondary | ICD-10-CM | POA: Diagnosis not present

## 2022-05-24 DIAGNOSIS — Z13820 Encounter for screening for osteoporosis: Secondary | ICD-10-CM | POA: Diagnosis not present

## 2022-05-24 DIAGNOSIS — Z6823 Body mass index (BMI) 23.0-23.9, adult: Secondary | ICD-10-CM | POA: Diagnosis not present

## 2022-05-24 DIAGNOSIS — Z01419 Encounter for gynecological examination (general) (routine) without abnormal findings: Secondary | ICD-10-CM | POA: Diagnosis not present

## 2022-07-22 ENCOUNTER — Telehealth: Payer: Self-pay | Admitting: Internal Medicine

## 2022-07-22 NOTE — Telephone Encounter (Signed)
Patient called and said Dr Renold Genta has had her on Losartan '25mg'$  for her BP, She said its been running high around 150 for the top number and the lower around 109. She's unsure how long its been going on but said she only realized this week and she wasn't sure if she should increase the amount she takes or if something else should be tried

## 2022-07-22 NOTE — Telephone Encounter (Signed)
Scheduled

## 2022-07-26 ENCOUNTER — Encounter: Payer: Self-pay | Admitting: Internal Medicine

## 2022-07-26 ENCOUNTER — Ambulatory Visit (INDEPENDENT_AMBULATORY_CARE_PROVIDER_SITE_OTHER): Payer: BC Managed Care – PPO | Admitting: Internal Medicine

## 2022-07-26 ENCOUNTER — Other Ambulatory Visit: Payer: Self-pay | Admitting: Internal Medicine

## 2022-07-26 VITALS — BP 116/76 | HR 87 | Temp 98.9°F | Ht 66.0 in | Wt 143.4 lb

## 2022-07-26 DIAGNOSIS — I10 Essential (primary) hypertension: Secondary | ICD-10-CM

## 2022-07-26 MED ORDER — ALPRAZOLAM 0.25 MG PO TABS: 0.2500 mg | ORAL_TABLET | Freq: Two times a day (BID) | ORAL | 0 refills | Status: DC | PRN

## 2022-07-26 MED ORDER — LOSARTAN POTASSIUM 50 MG PO TABS: 50.0000 mg | ORAL_TABLET | Freq: Every day | ORAL | 0 refills | Status: DC

## 2022-07-26 NOTE — Progress Notes (Signed)
   Subjective:    Patient ID: EABHA ORBE, female    DOB: 08/30/1960, 62 y.o.   MRN: [id number]  HPI Patient requested appointment regarding elevated blood pressure readings on losartan 25 mg daily.  Patient reports blood pressures been running as high as [id number] systolically and around 0000000 diastolically.  She is seen here today for evaluation.  This came about because she was scheduled to have some minor plastic surgery with Dr. Nathanial Rancher and her blood pressure was elevated at that time.  This was concerning to her but she was anxious about the procedure and actually canceled it.  She saw Dr. Helane Rima in November for GYN exam and blood pressure was 118/78.  She had health maintenance exam.  May 2023 and her blood pressure was 120/86.  Has been taking losartan 25 mg daily for some time and blood pressure has been well-controlled.  In March 2022 her blood pressure was 140/90.  Social history: She is married.  Has smoked for well over 25 years just a few cigarettes daily.  She has 3 children, a daughter and 2 sons.  Social alcohol consumption.  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  Takes care of her mother and her sister.  Family history: Father with history of diabetes.  1 sister with history of epilepsy and Down syndrome.  Maternal grandmother with history of coronary artery disease and hypertension.  Review of Systems no pounding headache or flushing.  No chest pain.  No shortness of breath.     Objective:   Physical Exam Blood pressure today is 120/90, pulse 87, temperature 98.9 degrees pulse oximetry 99% weight 143 pounds 6.4 ounces  Blood pressure rechecked and is 116/76 which is excellent       Assessment & Plan:   Elevated blood pressure readings recently.  May increase to 50 mg but I think she may not have been taking the losartan 25 mg daily regularly.  Asked her to continue to monitor this when taking medication  consistently.

## 2022-07-27 LAB — COMPLETE METABOLIC PANEL WITH GFR
AG Ratio: 2.6 (calc) — ABNORMAL HIGH (ref 1.0–2.5)
ALT: 17 U/L (ref 6–29)
AST: 17 U/L (ref 10–35)
Albumin: 4.6 g/dL (ref 3.6–5.1)
Alkaline phosphatase (APISO): 126 U/L (ref 37–153)
BUN/Creatinine Ratio: 19 (calc) (ref 6–22)
BUN: 21 mg/dL (ref 7–25)
CO2: 21 mmol/L (ref 20–32)
Calcium: 9.5 mg/dL (ref 8.6–10.4)
Chloride: 104 mmol/L (ref 98–110)
Creat: 1.11 mg/dL — ABNORMAL HIGH (ref 0.50–1.05)
Globulin: 1.8 g/dL (calc) — ABNORMAL LOW (ref 1.9–3.7)
Glucose, Bld: 109 mg/dL — ABNORMAL HIGH (ref 65–99)
Potassium: 4.6 mmol/L (ref 3.5–5.3)
Sodium: 139 mmol/L (ref 135–146)
Total Bilirubin: 0.5 mg/dL (ref 0.2–1.2)
Total Protein: 6.4 g/dL (ref 6.1–8.1)
eGFR: 57 mL/min/{1.73_m2} — ABNORMAL LOW (ref >=60)

## 2022-07-27 LAB — CBC WITH DIFFERENTIAL/PLATELET
Absolute Monocytes: 336 cells/uL (ref 200–950)
Basophils Absolute: 29 cells/uL (ref 0–200)
Basophils Relative: 0.5 %
Eosinophils Absolute: 70 cells/uL (ref 15–500)
Eosinophils Relative: 1.2 %
HCT: 41.9 % (ref 35.0–45.0)
Hemoglobin: 14.7 g/dL (ref 11.7–15.5)
Lymphs Abs: 1902 cells/uL (ref 850–3900)
MCH: 32.5 pg (ref 27.0–33.0)
MCHC: 35.1 g/dL (ref 32.0–36.0)
MCV: 92.7 fL (ref 80.0–100.0)
MPV: 10.1 fL (ref 7.5–12.5)
Monocytes Relative: 5.8 %
Neutro Abs: 3463 cells/uL (ref 1500–7800)
Neutrophils Relative %: 59.7 %
Platelets: 281 10*3/uL (ref 140–400)
RBC: 4.52 10*6/uL (ref 3.80–5.10)
RDW: 12.1 % (ref 11.0–15.0)
Total Lymphocyte: 32.8 %
WBC: 5.8 10*3/uL (ref 3.8–10.8)

## 2022-08-03 ENCOUNTER — Encounter: Payer: Self-pay | Admitting: Internal Medicine

## 2022-08-05 ENCOUNTER — Telehealth: Payer: Self-pay | Admitting: Internal Medicine

## 2022-08-05 NOTE — Telephone Encounter (Signed)
Patient called back and scheduled appt 

## 2022-08-05 NOTE — Telephone Encounter (Signed)
LVM to CB and schedule appointment for next Thursday ir Friday and to continue keeping record of blood pressure of at least twice a day.

## 2022-08-12 ENCOUNTER — Ambulatory Visit (INDEPENDENT_AMBULATORY_CARE_PROVIDER_SITE_OTHER): Payer: BC Managed Care – PPO | Admitting: Internal Medicine

## 2022-08-12 ENCOUNTER — Encounter: Payer: Self-pay | Admitting: Internal Medicine

## 2022-08-12 VITALS — BP 134/90 | HR 73 | Temp 98.6°F | Ht 66.0 in | Wt 141.8 lb

## 2022-08-12 DIAGNOSIS — I10 Essential (primary) hypertension: Secondary | ICD-10-CM | POA: Diagnosis not present

## 2022-08-12 MED ORDER — LOSARTAN POTASSIUM 100 MG PO TABS: 100.0000 mg | ORAL_TABLET | Freq: Every day | ORAL | 0 refills | Status: DC

## 2022-08-12 NOTE — Patient Instructions (Addendum)
Increase losartan to 100 mg daily. Call in 7-10 days with BP readings. May need different  antihypertensive med if BP persistently elevated. Take Xanax sparingly for situational stress.

## 2022-08-12 NOTE — Progress Notes (Addendum)
Patient Care Team: Elby Showers, MD as PCP - General (Internal Medicine) Starlyn Skeans as Physician Assistant (Dermatology)  Visit Date: 08/12/22  Subjective:    Patient ID: Connie Monroe , Female   DOB: [birth year], 62 y.o.    MRN: [id number]   62 y.o. Female presents today for an office visit regarding elevated BP readings.  She was seen previously on January 9th for elevated BP readings and started on Losartan.  She was seen in May 2023 for health maintenance exam.  Noted at that time to have several year history of mild hypertension and pure hypercholesterolemia.  History of vitamin D deficiency.  Blood pressure at that time was excellent at 120/86.   Patient reports that her BP has been elevated recently. She has been taking Losartan 50 mg daily for two weeks with no negative side effects. She has been monitoring her BP at home very regularly. She reports that she sometimes gets nervous before measuring, contributing to higher readings. Her blood pressure has measured at 139/89, 124/83, 118/76, 140/94, 128/88 recently according to her home blood pressure log. She reports that she sometimes experiences increased stress due to managing her sister's health situation. She is concerned that taking her Xanax for these high-stress times might affect her BP readings. Was started on losartan 50 mg daily at that time.   She says recently a basal   cell carcinoma was removed from her skin by Dr. Nathanial Rancher.    History of light smoking for several years.  Diagnosed with hypertension in 2013 and started on losartan 25 mg daily.  This worked well for her for a number of years.     Past Medical History:  Diagnosis Date   Atypical mole 07/26/2011   LEFT UPPER ABDOMEN   Squamous cell carcinoma of skin 09/19/2017   MID CHEST PLASTICS     Family History  Problem Relation Age of Onset   Diabetes Father    Seizures Sister          Review of Systems  Constitutional:   Negative for fever and malaise/fatigue.  HENT:  Negative for congestion.   Eyes:  Negative for blurred vision.  Respiratory:  Negative for cough and shortness of breath.   Cardiovascular:  Negative for chest pain, palpitations and leg swelling.       (+) Recent elevated BP  Gastrointestinal:  Negative for vomiting.  Musculoskeletal:  Negative for back pain.  Skin:  Negative for rash.  Neurological:  Negative for loss of consciousness and headaches.        Objective:   Vitals: BP 140/96, repeated 134/90 pulse is 73 and regular weight 141 pounds 12.8 ounces height 5 feet 6 inches, BMI 22.89   Physical Exam Vitals and nursing note reviewed.  Constitutional:      General: She is not in acute distress.    Appearance: Normal appearance. She is not toxic-appearing.  HENT:     Head: Normocephalic and atraumatic.  Cardiovascular:     Rate and Rhythm: Normal rate and regular rhythm. No extrasystoles are present.    Heart sounds: Normal heart sounds. No murmur heard.    No friction rub. No gallop.  Pulmonary:     Effort: Pulmonary effort is normal. No respiratory distress.     Breath sounds: Normal breath sounds. No wheezing or rales.  Abdominal:     General: There is no distension.  Skin:    General: Skin is warm and dry.  Neurological:  Mental Status: She is alert and oriented to person, place, and time. Mental status is at baseline.  Psychiatric:        Mood and Affect: Mood normal.        Behavior: Behavior normal.        Thought Content: Thought content normal.        Judgment: Judgment normal.       Results:   Studies obtained and personally reviewed by me:    Labs:      Component Value Date/Time   NA 139 07/26/2022 1300   K 4.6 07/26/2022 1300   CL 104 07/26/2022 1300   CO2 21 07/26/2022 1300   GLUCOSE 109 (H) 07/26/2022 1300   BUN 21 07/26/2022 1300   CREATININE 1.11 (H) 07/26/2022 1300   CALCIUM 9.5 07/26/2022 1300   PROT 6.4 07/26/2022 1300    ALBUMIN 4.2 08/05/2016 1220   AST 17 07/26/2022 1300   ALT 17 07/26/2022 1300   ALKPHOS 117 08/05/2016 1220   BILITOT 0.5 07/26/2022 1300   GFRNONAA 69 10/12/2020 0953   GFRAA 80 10/12/2020 0953     Lab Results  Component Value Date   WBC 5.8 07/26/2022   HGB 14.7 07/26/2022   HCT 41.9 07/26/2022   MCV 92.7 07/26/2022   PLT 281 07/26/2022    Lab Results  Component Value Date   CHOL 202 (H) 11/25/2021   HDL 78 11/25/2021   LDLCALC 109 (H) 11/25/2021   TRIG 63 11/25/2021   CHOLHDL 2.6 11/25/2021    Lab Results  Component Value Date   HGBA1C 5.3 01/27/2014     Lab Results  Component Value Date   TSH 0.62 11/25/2021      Assessment & Plan:    Hypertension: Losartan increased  from 50 mg to 100 mg daily. Take first thing in the morning. Pt can take 1/2 -1 Xanax tablet  0.25 mg as needed intermittently for anxiety and stress. She will allow several days for this increased dose to work and let us know if BP not improving. Has CPE scheduled for May. May need labs before then with increased dose of Losartan.  Situational stress- has handicapped sister who lives in group home which presents urgent calls and needs at times which is stressful and anxiety provoking  Continue diet and exercise regimen.  Monitor blood pressure at home.   I,Alexander Ruley,acting as a Education administrator for Elby Showers, MD.,have documented all relevant documentation on the behalf of Elby Showers, MD,as directed by  Elby Showers, MD while in the presence of Elby Showers, MD.   I, Elby Showers, MD, have reviewed all documentation for this visit. The documentation on 08/12/22 for the exam, diagnosis, procedures, and orders are all accurate and complete.

## 2022-08-16 DIAGNOSIS — Z0289 Encounter for other administrative examinations: Secondary | ICD-10-CM

## 2022-09-11 NOTE — Patient Instructions (Addendum)
Suggest patient continue to monitor blood pressure readings and take losartan at least 25 mg daily and may increase to 50 mg daily if necessary.

## 2022-11-24 NOTE — Progress Notes (Signed)
Patient Care Team: Margaree Mackintosh, MD as PCP - General (Internal Medicine) Suzi Roots as Physician Assistant (Dermatology)  Visit Date: 12/01/22  Subjective:    Patient ID: Connie Monroe , Female   DOB: [birth year], 62 y.o.    MRN: [id number]   62 y.o. Female presents today for annual comprehensive physical exam.  History of anxiety. Currently stable without medication.  History of hypertension treated with losartan 100 mg daily. Blood pressure normal today at 132/84.  History of squamous cell carcinoma mid chest 2019, atypical nevus left upper abdomen 2013. Had squamous cell carcinomas removed from leg more recently.  Glucose normal. Creatinine elevated at 1.15. GFR low at 54. Calcium elevated at 10.7. CHOL elevated at 205, LDL elevated at 112. CBC normal. Kidney, liver functions normal. Electrolytes normal. Blood proteins normal.  Pap smear last completed 03/31/20. Negative for intraepithelial lesion or malignancy. Recommended repeat in 2024.  Mammogram last completed 04/13/22. No mammographic evidence of malignancy. Recommended repeat in 2025.  Colonoscopy last completed 05/29/13. Entire examined colon normal. Small non-bleeding internal hemorrhoids. Recommended repeat in 2021.  T-score at -2.5 at [facility] in 2021. Reports having an improved score more recently.  Past Medical History:  Diagnosis Date   Atypical mole 07/26/2011   LEFT UPPER ABDOMEN   Hypertension    Squamous cell carcinoma of skin 09/19/2017   MID CHEST PLASTICS     Family History  Problem Relation Age of Onset   Diabetes Father    Seizures Sister     Social History   Social History Narrative   Not on file      Review of Systems  Constitutional:  Negative for chills, fever, malaise/fatigue and weight loss.  HENT:  Negative for hearing loss, sinus pain and sore throat.   Respiratory:  Negative for cough, hemoptysis and shortness of breath.   Cardiovascular:  Negative for chest  pain, palpitations, leg swelling and PND.  Gastrointestinal:  Negative for abdominal pain, constipation, diarrhea, heartburn, nausea and vomiting.  Genitourinary:  Negative for dysuria, frequency and urgency.  Musculoskeletal:  Negative for back pain, myalgias and neck pain.  Skin:  Negative for itching and rash.  Neurological:  Negative for dizziness, tingling, seizures and headaches.  Endo/Heme/Allergies:  Negative for polydipsia.  Psychiatric/Behavioral:  Negative for depression. The patient is not nervous/anxious.         Objective:   Vitals: BP 132/84   Pulse 76   Temp 97.9 F (36.6 C) (Tympanic)   Ht 5\' 6"  (1.676 m)   Wt 134 lb (60.8 kg)   SpO2 98%   BMI 21.63 kg/m    Physical Exam Vitals and nursing note reviewed.  Constitutional:      General: She is not in acute distress.    Appearance: Normal appearance. She is not ill-appearing or toxic-appearing.  HENT:     Head: Normocephalic and atraumatic.     Right Ear: Hearing, tympanic membrane, ear canal and external ear normal.     Left Ear: Hearing, tympanic membrane, ear canal and external ear normal.     Mouth/Throat:     Pharynx: Oropharynx is clear.  Eyes:     Extraocular Movements: Extraocular movements intact.     Pupils: Pupils are equal, round, and reactive to light.  Neck:     Thyroid: No thyroid mass, thyromegaly or thyroid tenderness.     Vascular: No carotid bruit.  Cardiovascular:     Rate and Rhythm: Normal rate and regular  rhythm. No extrasystoles are present.    Pulses:          Dorsalis pedis pulses are 1+ on the right side and 1+ on the left side.     Heart sounds: Normal heart sounds. No murmur heard.    No friction rub. No gallop.  Pulmonary:     Effort: Pulmonary effort is normal.     Breath sounds: Normal breath sounds. No decreased breath sounds, wheezing, rhonchi or rales.  Chest:     Chest wall: No mass.  Abdominal:     Palpations: Abdomen is soft. There is no hepatomegaly,  splenomegaly or mass.     Tenderness: There is no abdominal tenderness.     Hernia: No hernia is present.  Musculoskeletal:     Cervical back: Normal range of motion.     Right lower leg: No edema.     Left lower leg: No edema.  Lymphadenopathy:     Cervical: No cervical adenopathy.     Upper Body:     Right upper body: No supraclavicular adenopathy.     Left upper body: No supraclavicular adenopathy.  Skin:    General: Skin is warm and dry.  Neurological:     General: No focal deficit present.     Mental Status: She is alert and oriented to person, place, and time. Mental status is at baseline.     Sensory: Sensation is intact.     Motor: Motor function is intact. No weakness.     Deep Tendon Reflexes: Reflexes are normal and symmetric.  Psychiatric:        Attention and Perception: Attention normal.        Mood and Affect: Mood normal.        Speech: Speech normal.        Behavior: Behavior normal.        Thought Content: Thought content normal.        Cognition and Memory: Cognition normal.        Judgment: Judgment normal.       Results:   Studies obtained and personally reviewed by me:  Pap smear last completed 03/31/20. Negative for intraepithelial lesion or malignancy. Recommended repeat in 2024.  Mammogram last completed 04/13/22. No mammographic evidence of malignancy. Recommended repeat in 2025.  Colonoscopy last completed 05/29/13. Entire examined colon normal. Small non-bleeding internal hemorrhoids. Recommended repeat in 2021.  T-score at -2.5 at [facility] in 2021. Reports having an improved score more recently.  Labs:       Component Value Date/Time   NA 142 11/28/2022 0917   K 4.6 11/28/2022 0917   CL 106 11/28/2022 0917   CO2 28 11/28/2022 0917   GLUCOSE 91 11/28/2022 0917   BUN 13 11/28/2022 0917   CREATININE 1.15 (H) 11/28/2022 0917   CALCIUM 10.7 (H) 11/28/2022 0917   PROT 6.6 11/28/2022 0917   ALBUMIN 4.2 08/05/2016 1220   AST 16 11/28/2022  0917   ALT 12 11/28/2022 0917   ALKPHOS 117 08/05/2016 1220   BILITOT 1.0 11/28/2022 0917   GFRNONAA 69 10/12/2020 0953   GFRAA 80 10/12/2020 0953     Lab Results  Component Value Date   WBC 5.0 11/28/2022   HGB 14.5 11/28/2022   HCT 42.6 11/28/2022   MCV 94.7 11/28/2022   PLT 263 11/28/2022    Lab Results  Component Value Date   CHOL 205 (H) 11/28/2022   HDL 72 11/28/2022   LDLCALC 112 (H) 11/28/2022  TRIG 99 11/28/2022   CHOLHDL 2.8 11/28/2022    Lab Results  Component Value Date   HGBA1C 5.3 01/27/2014     Lab Results  Component Value Date   TSH 1.24 11/28/2022      Assessment & Plan:   Anxiety: currently stable without medication.  Hypertension: treated with losartan 100 mg daily. Blood pressure normal today at 130/82 on reassessment.  Pap smear: last completed 03/31/20. Negative for intraepithelial lesion or malignancy. Pelvic deferred to gynecologist.   Mammogram: last completed 04/13/22. No mammographic evidence of malignancy. Recommended repeat in 2025.  Colonoscopy: last completed 05/29/13. Entire examined colon normal. Small non-bleeding internal hemorrhoids. Recommended repeat in 2021.  T-score -2.5 at [facility] in 2021. Reports having an improved score more recently.  Given Zithromax Z-Pak for travel, Valtrex at her request.  Vaccine counseling: UTD on flu, tetanus, shingles vaccines.  Return in several weeks for BUN/creatinine, calcium.     I,Alexander Ruley,acting as a Neurosurgeon for Margaree Mackintosh, MD.,have documented all relevant documentation on the behalf of Margaree Mackintosh, MD,as directed by  Margaree Mackintosh, MD while in the presence of Margaree Mackintosh, MD.   I, Margaree Mackintosh, MD, have reviewed all documentation for this visit. The documentation on 01/01/23 for the exam, diagnosis, procedures, and orders are all accurate and complete.

## 2022-11-25 ENCOUNTER — Other Ambulatory Visit: Payer: Self-pay | Admitting: Internal Medicine

## 2022-11-28 ENCOUNTER — Other Ambulatory Visit: Payer: No Typology Code available for payment source

## 2022-11-28 DIAGNOSIS — E7800 Pure hypercholesterolemia, unspecified: Secondary | ICD-10-CM

## 2022-11-28 DIAGNOSIS — R5383 Other fatigue: Secondary | ICD-10-CM

## 2022-11-28 DIAGNOSIS — I10 Essential (primary) hypertension: Secondary | ICD-10-CM

## 2022-11-29 LAB — CBC WITH DIFFERENTIAL/PLATELET
Absolute Monocytes: 390 cells/uL (ref 200–950)
Basophils Absolute: 40 cells/uL (ref 0–200)
Basophils Relative: 0.8 %
Eosinophils Absolute: 150 cells/uL (ref 15–500)
Eosinophils Relative: 3 %
HCT: 42.6 % (ref 35.0–45.0)
Hemoglobin: 14.5 g/dL (ref 11.7–15.5)
Lymphs Abs: 1465 cells/uL (ref 850–3900)
MCH: 32.2 pg (ref 27.0–33.0)
MCHC: 34 g/dL (ref 32.0–36.0)
MCV: 94.7 fL (ref 80.0–100.0)
MPV: 10.2 fL (ref 7.5–12.5)
Monocytes Relative: 7.8 %
Neutro Abs: 2955 cells/uL (ref 1500–7800)
Neutrophils Relative %: 59.1 %
Platelets: 263 10*3/uL (ref 140–400)
RBC: 4.5 10*6/uL (ref 3.80–5.10)
RDW: 12.3 % (ref 11.0–15.0)
Total Lymphocyte: 29.3 %
WBC: 5 10*3/uL (ref 3.8–10.8)

## 2022-11-29 LAB — COMPLETE METABOLIC PANEL WITH GFR
AG Ratio: 2.3 (calc) (ref 1.0–2.5)
ALT: 12 U/L (ref 6–29)
AST: 16 U/L (ref 10–35)
Albumin: 4.6 g/dL (ref 3.6–5.1)
Alkaline phosphatase (APISO): 90 U/L (ref 37–153)
BUN/Creatinine Ratio: 11 (calc) (ref 6–22)
BUN: 13 mg/dL (ref 7–25)
CO2: 28 mmol/L (ref 20–32)
Calcium: 10.7 mg/dL — ABNORMAL HIGH (ref 8.6–10.4)
Chloride: 106 mmol/L (ref 98–110)
Creat: 1.15 mg/dL — ABNORMAL HIGH (ref 0.50–1.05)
Globulin: 2 g/dL (calc) (ref 1.9–3.7)
Glucose, Bld: 91 mg/dL (ref 65–99)
Potassium: 4.6 mmol/L (ref 3.5–5.3)
Sodium: 142 mmol/L (ref 135–146)
Total Bilirubin: 1 mg/dL (ref 0.2–1.2)
Total Protein: 6.6 g/dL (ref 6.1–8.1)
eGFR: 54 mL/min/{1.73_m2} — ABNORMAL LOW (ref >=60)

## 2022-11-29 LAB — LIPID PANEL
Cholesterol: 205 mg/dL — ABNORMAL HIGH (ref <200)
HDL: 72 mg/dL (ref >=50)
LDL Cholesterol (Calc): 112 mg/dL (calc) — ABNORMAL HIGH
Non-HDL Cholesterol (Calc): 133 mg/dL (calc) — ABNORMAL HIGH (ref <130)
Total CHOL/HDL Ratio: 2.8 (calc) (ref <5.0)
Triglycerides: 99 mg/dL (ref <150)

## 2022-11-29 LAB — TSH: TSH: 1.24 mIU/L (ref 0.40–4.50)

## 2022-12-01 ENCOUNTER — Encounter: Payer: Self-pay | Admitting: Internal Medicine

## 2022-12-01 ENCOUNTER — Ambulatory Visit (INDEPENDENT_AMBULATORY_CARE_PROVIDER_SITE_OTHER): Payer: No Typology Code available for payment source | Admitting: Internal Medicine

## 2022-12-01 VITALS — BP 132/84 | HR 76 | Temp 97.9°F | Ht 66.0 in | Wt 134.0 lb

## 2022-12-01 DIAGNOSIS — Z0000 Encounter for general adult medical examination without abnormal findings: Secondary | ICD-10-CM

## 2022-12-01 DIAGNOSIS — E7800 Pure hypercholesterolemia, unspecified: Secondary | ICD-10-CM

## 2022-12-01 DIAGNOSIS — I10 Essential (primary) hypertension: Secondary | ICD-10-CM | POA: Diagnosis not present

## 2022-12-01 MED ORDER — AZITHROMYCIN 250 MG PO TABS: ORAL_TABLET | ORAL | 1 refills | Status: AC

## 2022-12-01 MED ORDER — VALACYCLOVIR HCL 500 MG PO TABS: ORAL_TABLET | ORAL | 2 refills | Status: DC

## 2022-12-26 ENCOUNTER — Other Ambulatory Visit: Payer: No Typology Code available for payment source

## 2022-12-26 DIAGNOSIS — I10 Essential (primary) hypertension: Secondary | ICD-10-CM

## 2022-12-27 LAB — COMPLETE METABOLIC PANEL WITH GFR
AG Ratio: 2.3 (calc) (ref 1.0–2.5)
ALT: 16 U/L (ref 6–29)
AST: 17 U/L (ref 10–35)
Albumin: 4.5 g/dL (ref 3.6–5.1)
Alkaline phosphatase (APISO): 86 U/L (ref 37–153)
BUN: 15 mg/dL (ref 7–25)
CO2: 26 mmol/L (ref 20–32)
Calcium: 9.6 mg/dL (ref 8.6–10.4)
Chloride: 104 mmol/L (ref 98–110)
Creat: 1 mg/dL (ref 0.50–1.05)
Globulin: 2 g/dL (calc) (ref 1.9–3.7)
Glucose, Bld: 87 mg/dL (ref 65–99)
Potassium: 4.4 mmol/L (ref 3.5–5.3)
Sodium: 138 mmol/L (ref 135–146)
Total Bilirubin: 0.7 mg/dL (ref 0.2–1.2)
Total Protein: 6.5 g/dL (ref 6.1–8.1)
eGFR: 64 mL/min/{1.73_m2} (ref >=60)

## 2023-01-01 NOTE — Patient Instructions (Addendum)
It was a pleasure to see you today.  Continue losartan 100 mg daily for hypertension.    Continue to monitor bone density.  Refilled Valtrex.  Take Zithromax Z-PAK with you on your travel to [place].

## 2023-02-03 ENCOUNTER — Other Ambulatory Visit: Payer: Self-pay | Admitting: Internal Medicine

## 2023-02-06 ENCOUNTER — Other Ambulatory Visit: Payer: Self-pay

## 2023-02-06 MED ORDER — LOSARTAN POTASSIUM 100 MG PO TABS: 100.0000 mg | ORAL_TABLET | Freq: Every day | ORAL | 0 refills | Status: DC

## 2023-04-20 LAB — HM MAMMOGRAPHY

## 2023-08-25 ENCOUNTER — Telehealth: Payer: Self-pay | Admitting: Internal Medicine

## 2023-08-25 ENCOUNTER — Other Ambulatory Visit: Payer: Self-pay | Admitting: Internal Medicine

## 2023-08-25 MED ORDER — LOSARTAN POTASSIUM 100 MG PO TABS: 100.0000 mg | ORAL_TABLET | Freq: Every day | ORAL | 1 refills | Status: DC

## 2023-08-25 NOTE — Telephone Encounter (Signed)
 Copied from CRM [id number]. Topic: Appointments - Scheduling Inquiry for Clinic >> Aug 25, 2023 12:26 PM May Sparks wrote: Patient Connie Monroe scheduled her physical but would like to know if she needs to do labs a week prior. Please advise

## 2023-08-25 NOTE — Telephone Encounter (Signed)
 Called patient back to scheduled lab appointment

## 2023-09-11 LAB — HM COLONOSCOPY

## 2023-11-15 NOTE — Progress Notes (Addendum)
 Annual Wellness Visit   Patient Care Team: Thaddeus Evitts, Jaynie Meyers, MD as PCP - General (Internal Medicine) Reggy Capers as Physician Assistant (Dermatology)  Visit Date: 12/04/23   Chief Complaint  Patient presents with   Annual Exam   Subjective:  Patient: Connie Monroe, Female DOB: 28-Feb-1961, 63 y.o. MRN: [id number]  Connie Monroe is a 63 y.o. Female who presents today for her Annual Wellness Visit. Patient has Herpes Simplex Type 1 Infection; Hypertension; Anxiety State; and History Of Smoking.  History of Hypertension treated with Losartan  100 mg daily. Blood Pressure: normotensive today at 100/60.   History of Hyperlipidemia with  11/30/2023 Lipid Panel, compared to 5/20224: Cholesterol 216, elevated from 205; LDL 116, elevated from 112. Discussed CT Coronary Calcium Scoring.   History of Herpes Simplex Virus, type I treated with Valtrex  500 mg  History of Anxiety treated with Xanax  0.25 mg.  Takes Vitamin- B6, Vitamin D3, Calcium, and Magnesium.  Labs 11/30/2023 CBC: WNL CMP: WNL  TSH: 0.95  S/p Uterine Ablation 01/2006. History of Hormone Replacement Therapy w/ Estradiol patch twice weekly and Progesterone 100 mg at bedtime. Followed by Dr. Serge Dancer. Bone Density 09/14/2023 done through gynecologist's office, determined osteopenia w/ recommended repeat 2030.   Mammogram 04/20/2023  normal  with repeat recommendation of 2025.  Colonoscopy 08/01/2023 by Dr. Tami Falcon with repeat recommendation of 2030.  Vaccine Counseling: Due for Covid-19; UTD on Flu, Shingles 2/2, and Tdap. Past Medical History:  Diagnosis Date   Atypical mole 07/26/2011   LEFT UPPER ABDOMEN   Hypertension    Squamous cell carcinoma of skin 09/19/2017   MID CHEST PLASTICS   Medical/Surgical History Narrative:  Allergic/Intolerant to: No Known Allergies  2024 - Squamous Cell Carcinomas removed from leg  2020/2021 - Torn Right ACL on a ski trip to [place] in December '20. Underwent  reconstruction surgery by Dr. Hazeline Lister in 2021. Family History  Problem Relation Age of Onset   Diabetes Father    Seizures Sister    Family History Narrative: Father w/ hx of Diabetes Maternal Grandmother w/ hx of Coronary Artery Disease and Hypertension Mother w/ hx of Colonic Polyps Sister w/ hx of Advanced Dementia, Epilepsy and Down Syndrome Social History   Social History Narrative   2025 - Works from home, formerly worked as a Environmental consultant. Married. 3 children - 1 daughter (recently got married), 2 sons (1 is a Engineer, agricultural; other son moving to [facility] for ENT studies). Smokes just a few cigarettes daily x25 years, social alcohol consumption. Takes care of her mother. Her sister, who has severe mental disabilities, is now living in a home being taken care of. Recently returned from trip to [place] where her daughter got married.  Review of Systems  Constitutional:  Negative for chills, fever, malaise/fatigue and weight loss.  HENT:  Negative for hearing loss, sinus pain and sore throat.   Respiratory:  Negative for cough, hemoptysis and shortness of breath.   Cardiovascular:  Negative for chest pain, palpitations, leg swelling and PND.  Gastrointestinal:  Negative for abdominal pain, constipation, diarrhea, heartburn, nausea and vomiting.  Genitourinary:  Negative for dysuria, frequency and urgency.  Musculoskeletal:  Negative for back pain, myalgias and neck pain.  Skin:  Negative for itching and rash.  Neurological:  Negative for dizziness, tingling, seizures and headaches.  Endo/Heme/Allergies:  Negative for polydipsia.  Psychiatric/Behavioral:  Negative for depression. The patient is not nervous/anxious.     Objective:  Vitals: BP 100/60  Pulse 92   Ht 5\' 6"  (1.676 m)   Wt 126 lb (57.2 kg)   SpO2 96%   BMI 20.34 kg/m  Physical Exam Vitals and nursing note reviewed.  Constitutional:      General: She is not in acute distress.    Appearance: Normal  appearance. She is not ill-appearing or toxic-appearing.  HENT:     Head: Normocephalic and atraumatic.     Right Ear: Hearing, tympanic membrane, ear canal and external ear normal.     Left Ear: Hearing, tympanic membrane, ear canal and external ear normal.     Mouth/Throat:     Pharynx: Oropharynx is clear.  Eyes:     Extraocular Movements: Extraocular movements intact.     Pupils: Pupils are equal, round, and reactive to light.  Neck:     Thyroid: No thyroid mass, thyromegaly or thyroid tenderness.     Vascular: No carotid bruit.  Cardiovascular:     Rate and Rhythm: Normal rate and regular rhythm. No extrasystoles are present.    Pulses:          Dorsalis pedis pulses are 2+ on the right side and 2+ on the left side.     Heart sounds: Normal heart sounds. No murmur heard.    No friction rub. No gallop.  Pulmonary:     Effort: Pulmonary effort is normal.     Breath sounds: Normal breath sounds. No decreased breath sounds, wheezing, rhonchi or rales.  Chest:     Chest wall: No mass.  Abdominal:     Palpations: Abdomen is soft. There is no hepatomegaly, splenomegaly or mass.     Tenderness: There is no abdominal tenderness.     Hernia: No hernia is present.  Musculoskeletal:     Cervical back: Normal range of motion.     Right lower leg: No edema.     Left lower leg: No edema.  Lymphadenopathy:     Cervical: No cervical adenopathy.     Upper Body:     Right upper body: No supraclavicular adenopathy.     Left upper body: No supraclavicular adenopathy.  Skin:    General: Skin is warm and dry.  Neurological:     General: No focal deficit present.     Mental Status: She is alert and oriented to person, place, and time. Mental status is at baseline.     Sensory: Sensation is intact.     Motor: Motor function is intact. No weakness.     Deep Tendon Reflexes: Reflexes are normal and symmetric.  Psychiatric:        Attention and Perception: Attention normal.        Mood and  Affect: Mood normal.        Speech: Speech normal.        Behavior: Behavior normal.        Thought Content: Thought content normal.        Cognition and Memory: Cognition normal.        Judgment: Judgment normal.    Most Recent Fall Risk Assessment:    12/01/2022   11:15 AM  Fall Risk   Falls in the past year? 0  Number falls in past yr: 0  Injury with Fall? 0  Risk for fall due to : No Fall Risks  Follow up Falls prevention discussed   Most Recent Depression Screenings:    12/04/2023    2:00 PM 12/01/2022   11:16 AM  PHQ 2/9 Scores  PHQ - 2 Score 0 0   Results:  Studies Obtained And Personally Reviewed By Me:  Bone Density 09/14/2023 done through gynecologist's office, determined osteopenia.   Mammogram 04/20/2023  normal.  Colonoscopy 08/01/2023 by Dr. Tami Falcon.  Labs:     Component Value Date/Time   NA 137 11/30/2023 0922   K 4.2 11/30/2023 0922   CL 100 11/30/2023 0922   CO2 29 11/30/2023 0922   GLUCOSE 83 11/30/2023 0922   BUN 15 11/30/2023 0922   CREATININE 0.97 11/30/2023 0922   CALCIUM 10.0 11/30/2023 0922   PROT 6.8 11/30/2023 0922   ALBUMIN 4.2 08/05/2016 1220   AST 19 11/30/2023 0922   ALT 18 11/30/2023 0922   ALKPHOS 117 08/05/2016 1220   BILITOT 1.0 11/30/2023 0922   GFRNONAA 69 10/12/2020 0953   GFRAA 80 10/12/2020 0953    Lab Results  Component Value Date   WBC 4.8 11/30/2023   HGB 15.1 11/30/2023   HCT 44.2 11/30/2023   MCV 94.4 11/30/2023   PLT 265 11/30/2023   Lab Results  Component Value Date   CHOL 216 (H) 11/30/2023   HDL 82 11/30/2023   LDLCALC 116 (H) 11/30/2023   TRIG 84 11/30/2023   CHOLHDL 2.6 11/30/2023   Lab Results  Component Value Date   HGBA1C 5.3 01/27/2014    Lab Results  Component Value Date   TSH 0.95 11/30/2023    Assessment & Plan:   Meds ordered this encounter  Medications   valACYclovir  (VALTREX ) 500 MG tablet    Sig: 0ne tab twice daily x 5 days    Dispense:  10 tablet    Refill:  PRN    losartan  (COZAAR ) 100 MG tablet    Sig: Take 1 tablet (100 mg total) by mouth daily.    Dispense:  34 each    Refill:  PRN   ALPRAZolam  (XANAX ) 0.25 MG tablet    Sig: Take 1 tablet (0.25 mg total) by mouth 2 (two) times daily as needed for anxiety.    Dispense:  30 tablet    Refill:  1  Other Labs Reviewed today: CBC: WNL CMP: WNL  TSH: 0.95  Hypertension treated with Losartan  100 mg daily. Blood Pressure: normotensive today at 100/60. Continue to monitor BP at home and let me know if persistently low. She feels well.  Hyperlipidemia with  11/30/2023 Lipid Panel, compared to 5/20224: Cholesterol 216, elevated from 205; LDL 116, elevated from 112. Discussed CT Coronary Calcium Scoring. This would help us  determine if statin is indicated. Patient will consider this.  Herpes Simplex Virus, type I treated with Valtrex  500 mg. Refilled today.   Anxiety treated with Xanax  0.25 mg. Refilled   S/p Uterine Ablation 01/2006.  Hormone Replacement Therapy w/ Estradiol patch twice weekly and Progesterone 100 mg at bedtime. Followed by Dr. Serge Dancer.   Bone Density 09/14/2023 done through gynecologist's office, determined osteopenia w/ recommended repeat 2030.   Mammogram 04/20/2023  normal  with repeat recommendation of 2025.  Colonoscopy done Feb 2025 by Dr. Tami Falcon with repeat recommendation of 2030.  Vaccine Counseling: Due for Covid-19; UTD on Flu, Shingles 2/2, and Tdap.  Return in 1 year (on 12/03/2024) for annual labs and 12/07/2023 for annual visit.   Annual wellness visit done today including the all of the following: Reviewed patient's Family Medical History Reviewed and updated list of patient's medical providers Assessment of cognitive impairment was done Assessed patient's functional ability Established a written schedule for health screening services  Health Risk Assessent Completed and Reviewed  Discussed health benefits of physical activity, and encouraged her to engage in  regular exercise appropriate for her age and condition.    I,Emily Lagle,acting as a Neurosurgeon for Sylvan Evener, MD.,have documented all relevant documentation on the behalf of Sylvan Evener, MD,as directed by  Sylvan Evener, MD while in the presence of Sylvan Evener, MD.   I, Sylvan Evener, MD, have reviewed all documentation for this visit. The documentation on 12/06/23 for the exam, diagnosis, procedures, and orders are all accurate and complete.

## 2023-11-27 ENCOUNTER — Encounter (HOSPITAL_COMMUNITY): Payer: Self-pay

## 2023-11-30 ENCOUNTER — Other Ambulatory Visit: Payer: No Typology Code available for payment source

## 2023-11-30 DIAGNOSIS — Z1329 Encounter for screening for other suspected endocrine disorder: Secondary | ICD-10-CM

## 2023-11-30 DIAGNOSIS — I10 Essential (primary) hypertension: Secondary | ICD-10-CM

## 2023-11-30 DIAGNOSIS — Z0000 Encounter for general adult medical examination without abnormal findings: Secondary | ICD-10-CM

## 2023-11-30 DIAGNOSIS — E7800 Pure hypercholesterolemia, unspecified: Secondary | ICD-10-CM

## 2023-12-01 ENCOUNTER — Ambulatory Visit: Payer: Self-pay | Admitting: Internal Medicine

## 2023-12-01 LAB — CBC WITH DIFFERENTIAL/PLATELET
Absolute Lymphocytes: 1267 {cells}/uL (ref 850–3900)
Absolute Monocytes: 427 {cells}/uL (ref 200–950)
Basophils Absolute: 38 {cells}/uL (ref 0–200)
Basophils Relative: 0.8 %
Eosinophils Absolute: 120 {cells}/uL (ref 15–500)
Eosinophils Relative: 2.5 %
HCT: 44.2 % (ref 35.0–45.0)
Hemoglobin: 15.1 g/dL (ref 11.7–15.5)
MCH: 32.3 pg (ref 27.0–33.0)
MCHC: 34.2 g/dL (ref 32.0–36.0)
MCV: 94.4 fL (ref 80.0–100.0)
MPV: 9.7 fL (ref 7.5–12.5)
Monocytes Relative: 8.9 %
Neutro Abs: 2947 {cells}/uL (ref 1500–7800)
Neutrophils Relative %: 61.4 %
Platelets: 265 10*3/uL (ref 140–400)
RBC: 4.68 10*6/uL (ref 3.80–5.10)
RDW: 12.6 % (ref 11.0–15.0)
Total Lymphocyte: 26.4 %
WBC: 4.8 10*3/uL (ref 3.8–10.8)

## 2023-12-01 LAB — LIPID PANEL
Cholesterol: 216 mg/dL — ABNORMAL HIGH (ref <200)
HDL: 82 mg/dL (ref >=50)
LDL Cholesterol (Calc): 116 mg/dL — ABNORMAL HIGH
Non-HDL Cholesterol (Calc): 134 mg/dL — ABNORMAL HIGH (ref <130)
Total CHOL/HDL Ratio: 2.6 (calc) (ref <5.0)
Triglycerides: 84 mg/dL (ref <150)

## 2023-12-01 LAB — TSH: TSH: 0.95 m[IU]/L (ref 0.40–4.50)

## 2023-12-01 LAB — COMPLETE METABOLIC PANEL WITHOUT GFR
AG Ratio: 2.4 (calc) (ref 1.0–2.5)
ALT: 18 U/L (ref 6–29)
AST: 19 U/L (ref 10–35)
Albumin: 4.8 g/dL (ref 3.6–5.1)
Alkaline phosphatase (APISO): 125 U/L (ref 37–153)
BUN: 15 mg/dL (ref 7–25)
CO2: 29 mmol/L (ref 20–32)
Calcium: 10 mg/dL (ref 8.6–10.4)
Chloride: 100 mmol/L (ref 98–110)
Creat: 0.97 mg/dL (ref 0.50–1.05)
Globulin: 2 g/dL (ref 1.9–3.7)
Glucose, Bld: 83 mg/dL (ref 65–99)
Potassium: 4.2 mmol/L (ref 3.5–5.3)
Sodium: 137 mmol/L (ref 135–146)
Total Bilirubin: 1 mg/dL (ref 0.2–1.2)
Total Protein: 6.8 g/dL (ref 6.1–8.1)

## 2023-12-04 ENCOUNTER — Ambulatory Visit (INDEPENDENT_AMBULATORY_CARE_PROVIDER_SITE_OTHER): Payer: No Typology Code available for payment source | Admitting: Internal Medicine

## 2023-12-04 ENCOUNTER — Encounter: Payer: Self-pay | Admitting: Internal Medicine

## 2023-12-04 VITALS — BP 100/60 | HR 92 | Ht 66.0 in | Wt 126.0 lb

## 2023-12-04 DIAGNOSIS — Z0000 Encounter for general adult medical examination without abnormal findings: Secondary | ICD-10-CM

## 2023-12-04 DIAGNOSIS — I10 Essential (primary) hypertension: Secondary | ICD-10-CM | POA: Diagnosis not present

## 2023-12-04 DIAGNOSIS — E7800 Pure hypercholesterolemia, unspecified: Secondary | ICD-10-CM | POA: Diagnosis not present

## 2023-12-04 MED ORDER — ALPRAZOLAM 0.25 MG PO TABS: 0.2500 mg | ORAL_TABLET | Freq: Two times a day (BID) | ORAL | 1 refills | Status: DC | PRN

## 2023-12-04 MED ORDER — VALACYCLOVIR HCL 500 MG PO TABS: ORAL_TABLET | ORAL | 99 refills | Status: AC

## 2023-12-04 MED ORDER — LOSARTAN POTASSIUM 100 MG PO TABS: 100.0000 mg | ORAL_TABLET | Freq: Every day | ORAL | 99 refills | Status: AC

## 2023-12-06 NOTE — Patient Instructions (Signed)
 Coronary calcium score would help us  determine if you need statin medication for cholesterol management. Please consider this. Continue to monitor blood pressure at home.

## 2023-12-21 ENCOUNTER — Encounter: Payer: Self-pay | Admitting: Internal Medicine

## 2023-12-22 ENCOUNTER — Ambulatory Visit: Payer: Self-pay | Admitting: Internal Medicine

## 2024-04-19 ENCOUNTER — Ambulatory Visit

## 2024-04-19 ENCOUNTER — Encounter: Payer: Self-pay | Admitting: Podiatry

## 2024-04-19 ENCOUNTER — Ambulatory Visit: Admitting: Podiatry

## 2024-04-19 VITALS — Ht 66.0 in | Wt 126.0 lb

## 2024-04-19 DIAGNOSIS — M7752 Other enthesopathy of left foot: Secondary | ICD-10-CM

## 2024-04-22 ENCOUNTER — Encounter: Payer: Self-pay | Admitting: Podiatry

## 2024-04-22 DIAGNOSIS — M7752 Other enthesopathy of left foot: Secondary | ICD-10-CM | POA: Diagnosis not present

## 2024-04-22 MED ORDER — TRIAMCINOLONE ACETONIDE 10 MG/ML IJ SUSP
10.0000 mg | Freq: Once | INTRAMUSCULAR | Status: AC
  Administered 2024-04-22: 10 mg via INTRA_ARTICULAR

## 2024-04-22 NOTE — Progress Notes (Signed)
 Subjective:   Patient ID: Connie Monroe, female   DOB: 63 y.o.   MRN: [id number]   HPI Patient states she has developed quite a bit of pain in her left ankle after having an injury approximately 3 weeks ago.  Patient states has been sore and hard to walk with.  Patient does not currently smoke likes to be active   Review of Systems  All other systems reviewed and are negative.       Objective:  Physical Exam Vitals and nursing note reviewed.  Constitutional:      Appearance: She is well-developed.  Pulmonary:     Effort: Pulmonary effort is normal.  Musculoskeletal:        General: Normal range of motion.  Skin:    General: Skin is warm.  Neurological:     Mental Status: She is alert.     Neurovascular status found to be intact muscle strength found to be adequate range of motion adequate patient does have edema in the left midfoot and around the ankle secondary to injury 4 weeks ago and has a lot of discomfort just lateral to the anterior tibial tendon left with inflammation of the ankle joint     Assessment:  Appears to be inflammatory and I am hoping it is just irritation into the ankle joint left with no other significant pathology     Plan:  H&P reviewed condition and I did do sterile prep I did careful injection into the ankle at the ankle joint 3 mg dexamethasone Kenalog  5 mg Xylocaine applied sterile dressing and we will review when it is possible we will need to get an MRI or CT scan at 1 point in the future  X-ray does indicate some suspicion of a possible avulsion fracture but it does not make sense in the area that her pain is so I am hoping that is more of a antidotal finding

## 2024-05-09 LAB — HM MAMMOGRAPHY

## 2024-05-10 ENCOUNTER — Encounter: Payer: Self-pay | Admitting: Internal Medicine

## 2024-05-16 ENCOUNTER — Encounter: Payer: Self-pay | Admitting: Internal Medicine

## 2024-05-23 ENCOUNTER — Telehealth: Payer: Self-pay | Admitting: Internal Medicine

## 2024-06-07 ENCOUNTER — Telehealth: Payer: Self-pay | Admitting: Internal Medicine

## 2024-06-11 NOTE — Progress Notes (Signed)
 Patient Care Team: Perri Ronal PARAS, MD as PCP - General (Internal Medicine) Porter Andrez SAUNDERS, PA-C (Inactive) as Physician Assistant (Dermatology) Kristie Lamprey, MD as Consulting Physician (Gastroenterology)  Visit Date: 06/25/24  Subjective:    Patient ID: Connie Monroe , Female   DOB: 09/16/1960, 63 y.o.    MRN: [id number]   63 y.o. Female presents today for pre-operative evaluation regarding breast implant surgery She needs preoperative clearance before the procedure.   History of Essential hypertension treated with Losartan  100 mg daily. Blood Pressure is normal  today at 120/80.    History of mild pure hypercholesterolemia with total cholesterol of 216 in May 2025,HDL was 82, Triglycerides were 84, and LDL was 116.   History of Herpes simplex Virus, Type I, treated with 5 day course of Valtrex  500 mg as needed   History of anxiety treated with Xanax  0.25 mg. twice daily as needed which she takes sparingly.    Also takes Vitamin- B6, Vitamin D3, Calcium, and Magnesium.  Patient recently saw Dr. Mat, Gynecologist for annual GYN exam on December 5th.  History of uterine ablation July 2007.  Is on hormone replacement therapy per Dr. Mat, Gynecologist.  Had ultrasound of left breast and mammogram at [facility] here in [facility] October 2025.  Had normal colonoscopy February 2025 by Dr. Kristie here in [place].  Squamous cell carcinoma removed from leg in 2024.  Underwent reconstruction surgery in 2021 for torn right ACL which occurred on ski trip to [place]  in December 2020.  Social history: Married.  Previously worked as a environmental consultant.  3 children, 1 daughter and 2 sons.  Smokes just a few cigarettes daily and has smoked for over 25 years.  Social alcohol consumption.  Family history: Father with history of diabetes.  1 sister with history of epilepsy and Down syndrome.  Maternal grandmother with history of coronary artery disease and  hypertension.     Past Medical History:  Diagnosis Date   Atypical mole 07/26/2011   LEFT UPPER ABDOMEN   Hypertension    Squamous cell carcinoma of skin 09/19/2017   MID CHEST PLASTICS     Family History  Problem Relation Age of Onset   Diabetes Father    Seizures Sister     Social History   Social History Narrative   2025 - Works from home, formerly worked as a environmental consultant. Married. 3 children - 1 daughter (recently got married), 2 sons (1 is a Engineer, Agricultural; other son moving to [facility] for ENT studies). Smokes just a few cigarettes daily x25 years, social alcohol consumption. Takes care of her mother. Her sister, who has severe mental disabilities, is now living in a home being taken care of. Recently returned from trip to [place] where her daughter got married.      Review of Systems  Constitutional:  Negative for fever and malaise/fatigue.  HENT:  Negative for congestion.   Eyes:  Negative for blurred vision.  Respiratory:  Negative for cough and shortness of breath.   Cardiovascular:  Negative for chest pain, palpitations and leg swelling.  Gastrointestinal:  Negative for vomiting.  Musculoskeletal:  Negative for back pain.  Skin:  Negative for rash.  Neurological:  Negative for loss of consciousness and headaches.        Objective:   Vitals: BP 120/80   Pulse 68   Wt 133 lb (60.3 kg)   SpO2 95%   BMI 21.47 kg/m    Physical Exam Vitals and  nursing note reviewed.  Constitutional:      General: She is not in acute distress.    Appearance: Normal appearance. She is not ill-appearing or toxic-appearing.  HENT:     Head: Normocephalic and atraumatic.     Right Ear: Hearing, tympanic membrane, ear canal and external ear normal.     Left Ear: Hearing, tympanic membrane, ear canal and external ear normal.     Mouth/Throat:     Pharynx: Oropharynx is clear.  Eyes:     Extraocular Movements: Extraocular movements intact.     Pupils: Pupils are  equal, round, and reactive to light.  Neck:     Thyroid: No thyroid mass, thyromegaly or thyroid tenderness.     Vascular: No carotid bruit.  Cardiovascular:     Rate and Rhythm: Normal rate and regular rhythm. No extrasystoles are present.    Pulses:          Dorsalis pedis pulses are 2+ on the right side and 2+ on the left side.     Heart sounds: Normal heart sounds. No murmur heard.    No friction rub. No gallop.  Pulmonary:     Effort: Pulmonary effort is normal.     Breath sounds: Normal breath sounds. No decreased breath sounds, wheezing, rhonchi or rales.  Chest:     Chest wall: No mass.  Abdominal:     Palpations: Abdomen is soft. There is no hepatomegaly, splenomegaly or mass.     Tenderness: There is no abdominal tenderness.     Hernia: No hernia is present.  Musculoskeletal:     Cervical back: Normal range of motion.     Right lower leg: No edema.     Left lower leg: No edema.  Lymphadenopathy:     Cervical: No cervical adenopathy.     Upper Body:     Right upper body: No supraclavicular adenopathy.     Left upper body: No supraclavicular adenopathy.  Skin:    General: Skin is warm and dry.  Neurological:     General: No focal deficit present.     Mental Status: She is alert and oriented to person, place, and time. Mental status is at baseline.     Sensory: Sensation is intact.     Motor: Motor function is intact. No weakness.     Deep Tendon Reflexes: Reflexes are normal and symmetric.  Psychiatric:        Attention and Perception: Attention normal.        Mood and Affect: Mood normal.        Speech: Speech normal.        Behavior: Behavior normal.        Thought Content: Thought content normal.        Cognition and Memory: Cognition normal.        Judgment: Judgment normal.       Results:    Labs:       Component Value Date/Time   NA 137 11/30/2023 0922   K 4.2 11/30/2023 0922   CL 100 11/30/2023 0922   CO2 29 11/30/2023 0922   GLUCOSE 83  11/30/2023 0922   BUN 15 11/30/2023 0922   CREATININE 0.97 11/30/2023 0922   CALCIUM 10.0 11/30/2023 0922   PROT 6.8 11/30/2023 0922   ALBUMIN 4.2 08/05/2016 1220   AST 19 11/30/2023 0922   ALT 18 11/30/2023 0922   ALKPHOS 117 08/05/2016 1220   BILITOT 1.0 11/30/2023 0922   GFRNONAA 69 10/12/2020 0953  GFRAA 80 10/12/2020 0953     Lab Results  Component Value Date   WBC 4.8 11/30/2023   HGB 15.1 11/30/2023   HCT 44.2 11/30/2023   MCV 94.4 11/30/2023   PLT 265 11/30/2023    Lab Results  Component Value Date   CHOL 216 (H) 11/30/2023   HDL 82 11/30/2023   LDLCALC 116 (H) 11/30/2023   TRIG 84 11/30/2023   CHOLHDL 2.6 11/30/2023    Lab Results  Component Value Date   HGBA1C 5.3 01/27/2014     Lab Results  Component Value Date   TSH 0.95 11/30/2023        Assessment & Plan:   Pre-operative clearance: She is having elective breast implant surgery in February and she is required to get preoperative clearance before the procedure.   Hypertension: treated with Losartan  100 mg daily. Blood Pressure is excellent at 120/80.    Hyperlipidemia: with 11/30/2023 Lipid Panel, compared to 5/20224: Cholesterol 216, elevated from 205; LDL 116, elevated from 112.     Herpes Simplex Virus, type I: treated with Valtrex  as needed   Anxiety: treated with Xanax  0.25 mg twice daily as needed   I,Makayla C Reid,acting as a scribe for Ronal JINNY Hailstone, MD.,have documented all relevant documentation on the behalf of Ronal JINNY Hailstone, MD,as directed by  Ronal JINNY Hailstone, MD while in the presence of Ronal JINNY Hailstone, MD.   I, Ronal JINNY Hailstone, MD, have reviewed all documentation for this visit. The documentation on 06/25/2024 for the exam, diagnosis, procedures, and orders are all accurate and complete.

## 2024-06-25 ENCOUNTER — Ambulatory Visit: Payer: Self-pay | Admitting: Internal Medicine

## 2024-06-25 ENCOUNTER — Encounter: Payer: Self-pay | Admitting: Internal Medicine

## 2024-06-25 VITALS — BP 120/80 | HR 68 | Wt 133.0 lb

## 2024-06-25 DIAGNOSIS — Z01818 Encounter for other preprocedural examination: Secondary | ICD-10-CM

## 2024-06-25 DIAGNOSIS — E7800 Pure hypercholesterolemia, unspecified: Secondary | ICD-10-CM

## 2024-06-25 DIAGNOSIS — I10 Essential (primary) hypertension: Secondary | ICD-10-CM

## 2024-06-25 NOTE — Patient Instructions (Addendum)
 Patient here for pre-operative assessment. EKG not performed today. Can be done by surgeon. Labs done in May at time of CPE were normal with the exception of LDL of 116 and total cholesterol of 216.

## 2024-06-28 NOTE — Telephone Encounter (Signed)
 done

## 2024-07-01 NOTE — Telephone Encounter (Signed)
 Done

## 2024-12-03 ENCOUNTER — Other Ambulatory Visit: Payer: Self-pay

## 2024-12-06 ENCOUNTER — Encounter: Payer: Self-pay | Admitting: Internal Medicine
